# Patient Record
Sex: Male | Born: 1970 | Race: Asian | Hispanic: No | Marital: Married | Smoking: Former smoker
Health system: Southern US, Community
[De-identification: ages and names within clinical notes are randomized; demographics above are authoritative.]

---

## 2013-10-14 ENCOUNTER — Ambulatory Visit: Payer: Self-pay | Admitting: Family Medicine

## 2013-10-14 VITALS — BP 144/86 | HR 64 | Temp 99.1°F | Resp 18 | Ht 66.5 in | Wt 153.0 lb

## 2013-10-14 DIAGNOSIS — L0100 Impetigo, unspecified: Secondary | ICD-10-CM

## 2013-10-14 MED ORDER — DOXYCYCLINE HYCLATE 100 MG PO TABS: 100.0000 mg | ORAL_TABLET | Freq: Two times a day (BID) | ORAL | Status: DC

## 2013-10-14 NOTE — Patient Instructions (Signed)
Ch?c L? (Impetigo) Ch?c l? l nhi?m trng da, ph? bi?n nh?t ? tr? nh? v tr? em. NGUYN NHN B?nh ny gy b?i vi trng t? c?u khu?n ho?c lin c?u khu?n (vi khu?n). Ch?c l? c th? b?t ??u sau b?t k? t?n th??ng no c?a da. T?n th??ng da c th? do nh?ng th? nh?:  Th?y ??u.  V?t co x??c.  Tr?y da.  Cn trng c?n (th??ng khi tr? gi v?t c?n).  V?t ??t.  C?n ho?c nhai mng tay. Ch?c l? c th? ly. N c th? ly truy?n t? ng??i ny sang ng??i khc. Trnh ch?m vo da ho?c dng chung kh?n t?m ho?c qu?n o. TRI?U CH?NG Ch?c l? th??ng b?t ??u ? d?ng nh?ng m?n n??c nh? ho?c m?n m?. Sau ? chng chuy?n thnh nh?ng v?t l? lot (th??ng t?n) c v?y mu vng. C?ng c th? c:  M?n n??c l?n.  Ng?a ho?c ?au.  M?.  H?ch b?ch huy?t s?ng. Khi gi, kch thch ho?c khng ?i?u tr?, nh?ng vng nh? ny c th? tr? nn l?n h?n. Gi c th? lm cho vi trng dnh vo d??i mng tay; sau ? gi ln ph?n khc c?a da c th? lm nhi?m trng ly sang ?. CH?N ?ON Ch?n ?on ch?c l? th??ng ???c th?c hi?n b?ng vi?c khm th?c th?. C?y da (xt nghi?m ?? lm vi khu?n m?c) c th? ???c th?c hi?n ?? ch?ng t? ch?n ?on ho?c ?? gip quy?t ??nh ph??ng php ?i?u tr? t?t nh?t. ?I?U TR? Ch?c l? nh? c th? ???c ?i?u tr? b?ng kem khng sinh k ??n. Thu?c khng sinh u?ng c th? ???c s? d?ng trong nh?ng tr??ng h?p n?ng h?n. Thu?c ch?a ng?a c th? ???c s? d?ng. H??NG D?N CH?M [place] T?I NH  ?? trnh ly lan ch?c l? sang nh?ng vng khc c?a c? th?:  Hy gi? mng tay ng?n v s?ch s?.  Trnh gi.  Che vng b? nhi?m trng n?u c?n ?? trnh gi.  Nh? nhng r?a vng b? nhi?m trng b?ng x phng c khng sinh v n??c.  Ngm vng c v?y b?ng n??c x phng ?m c s? d?ng x phng khng sinh.  Nh? nhng c? nh?ng vng ny ?? b? v?y. Khng ch?i.  Th??ng xuyn r?a tay ?? trnh ly lan b?nh nhi?m trng ny.  Gi? tr? em b? ch?c l? ? nh khng cho ??n tr??ng ho?c nh tr? cho ??n khi chng ? s? d?ng kem khng sinh trong 48 ti?ng (2 ngy)  ho?c thu?c khng sinh u?ng trong 24 ti?ng (1 ngy), v da chng c?i thi?n ?ng k?.  Tr? c th? ??n tr??ng ho?c nh tr? n?u chng ch? b? vi v?t lot v n?u cc v?t lot ny c th? ???c che b?ng b?ng ho?c qu?n o. HY ?I KHM N?U:  C nhi?u m?n n??c ho?c v?t lot h?n m?c d ? ?i?u tr?.  Cc thnh vin khc trong gia ?nh b? cc v?t lot.  Pht ban khng c?i thi?n sau 48 ti?ng (2 ngy) ?i?u tr?. HY NGAY L?P T?C ?I KHM N?U:  B?n th?y ly lan cc v?t ?? ho?c s?ng t?y c?a da quanh cc v?t lot.  B?n nhn th?y nh?ng v?t s?c ?? t? cc v?t lot.  Con b?n b? s?t t? 100,4 F (37,2 C) tr? ln.  Con b?n b? ?au h?ng.  Con b?n c v? m?t m?i (l? ph?, kh ch?u trong d? dy). Document Released: 09/11/2005 Document Revised: 05/14/2013 [facility] Patient Information 2014 [place], [place].

## 2013-10-14 NOTE — Progress Notes (Signed)
° °  Subjective:    Patient ID: Samuel Smith, male    DOB: [birth year], 43 y.o.   MRN: [id number]  HPI This chart was scribed for Kenyon AnaKurt Lauenstein-MD by Smiley HousemanFallon Davis, Scribe. This patient was seen in room 8 and the patient's care was started at 5:20 PM.  HPI Comments: Samuel Smith is a 43 y.o. male who presents to the [facility] complaining of an infection on his right lower abdomen that arose more than 2 weeks ago.  Pt denies pain to the area, but states the area itches.  Pt denies any other sites of infection, but states his entire stomach itches.  Pt states he tried ring worm cream without relief.  Pt does not speak [place] and he doesn't currently work.  His sister-in-law translated for him in today's visit.      No past surgical history on file.  Family History  Problem Relation Age of Onset   Stroke Father    Cancer Brother     History   Social History   Marital Status: Married    Spouse Name: N/A    Number of Children: N/A   Years of Education: N/A   Occupational History   Not on file.   Social History Main Topics   Smoking status: Former Smoker   Smokeless tobacco: Not on file   Alcohol Use: Yes   Drug Use: No   Sexual Activity: Not on file   Other Topics Concern   Not on file   Social History Narrative   No narrative on file    No Known Allergies  There are no active problems to display for this patient.   No results found for this or any previous visit.  Review of Systems  Constitutional: Negative for fever and chills.  HENT: Negative for congestion and rhinorrhea.   Respiratory: Negative for cough and shortness of breath.   Cardiovascular: Negative for chest pain.  Gastrointestinal: Negative for nausea, vomiting, abdominal pain and diarrhea.  Musculoskeletal: Negative for back pain.  Skin: Positive for rash. Negative for color change.  Neurological: Negative for syncope.       Objective:   Physical Exam  2cm crusty,  elevated plaque right mid abdomen without erythema at margins     Assessment & Plan:   Impetigo - Plan: doxycycline (VIBRA-TABS) 100 MG tablet  Signed, Elvina SidleKurt Lauenstein, MD  Signed, Elvina SidleKurt Lauenstein, MD

## 2013-10-23 ENCOUNTER — Ambulatory Visit (INDEPENDENT_AMBULATORY_CARE_PROVIDER_SITE_OTHER): Payer: Self-pay | Admitting: Family Medicine

## 2013-10-23 VITALS — BP 120/62 | HR 80 | Temp 98.1°F | Resp 16 | Ht 67.0 in | Wt 154.6 lb

## 2013-10-23 DIAGNOSIS — L0100 Impetigo, unspecified: Secondary | ICD-10-CM

## 2013-10-23 MED ORDER — MUPIROCIN CALCIUM 2 % EX CREA: 1.0000 "application " | TOPICAL_CREAM | Freq: Two times a day (BID) | CUTANEOUS | Status: DC

## 2013-10-23 NOTE — Progress Notes (Signed)
43 year old [place] gentleman returns after one week of doxycycline having been treated for impetigo. He notices no difference. He does have 13 mm satellite lesion just above the 1 x 2 cm right lower quadrant scaly plaque. There is no surrounding erythema.  Patient is now tried antifungals as well as doxycycline.   Objective: Exam confirms patient's observations. He's had no improvement.  Assessment: Unusual plaque-like lesion which may require biopsy or dermatology referral.  Plan:Impetigo - Plan: mupirocin cream (BACTROBAN) 2 %  Signed, Elvina SidleKurt Rosmarie Esquibel, MD

## 2013-11-21 ENCOUNTER — Other Ambulatory Visit: Payer: Self-pay

## 2013-11-21 DIAGNOSIS — L0100 Impetigo, unspecified: Secondary | ICD-10-CM

## 2013-11-21 NOTE — Telephone Encounter (Signed)
Dr L, do you want to RF this? Pended.

## 2013-11-21 NOTE — Telephone Encounter (Signed)
Patient's sister called on behalf of patient requesting RX Refill on Mupirocin 2% 15 grams. Please call patient at [phone].     Thank You!!!

## 2013-11-22 MED ORDER — MUPIROCIN CALCIUM 2 % EX CREA: 1.0000 "application " | TOPICAL_CREAM | Freq: Two times a day (BID) | CUTANEOUS | Status: DC

## 2015-04-06 ENCOUNTER — Ambulatory Visit (INDEPENDENT_AMBULATORY_CARE_PROVIDER_SITE_OTHER): Payer: 59

## 2015-04-06 ENCOUNTER — Ambulatory Visit (INDEPENDENT_AMBULATORY_CARE_PROVIDER_SITE_OTHER): Payer: 59 | Admitting: Family Medicine

## 2015-04-06 VITALS — BP 110/80 | HR 63 | Temp 98.1°F | Resp 18 | Ht 66.25 in | Wt 143.1 lb

## 2015-04-06 DIAGNOSIS — R634 Abnormal weight loss: Secondary | ICD-10-CM

## 2015-04-06 DIAGNOSIS — Z836 Family history of other diseases of the respiratory system: Secondary | ICD-10-CM

## 2015-04-06 DIAGNOSIS — R12 Heartburn: Secondary | ICD-10-CM | POA: Diagnosis not present

## 2015-04-06 DIAGNOSIS — R197 Diarrhea, unspecified: Secondary | ICD-10-CM

## 2015-04-06 DIAGNOSIS — R0602 Shortness of breath: Secondary | ICD-10-CM

## 2015-04-06 DIAGNOSIS — Z831 Family history of other infectious and parasitic diseases: Secondary | ICD-10-CM

## 2015-04-06 DIAGNOSIS — K219 Gastro-esophageal reflux disease without esophagitis: Secondary | ICD-10-CM

## 2015-04-06 LAB — POCT CBC
GRANULOCYTE PERCENT: 61.4 % (ref 37–80)
HEMATOCRIT: 48.4 % (ref 43.5–53.7)
HEMOGLOBIN: 16.3 g/dL (ref 14.1–18.1)
Lymph, poc: 1.6 (ref 0.6–3.4)
MCH: 30.3 pg (ref 27–31.2)
MCHC: 33.7 g/dL (ref 31.8–35.4)
MCV: 89.8 fL (ref 80–97)
MID (CBC): 0.6 (ref 0–0.9)
MPV: 7.9 fL (ref 0–99.8)
POC Granulocyte: 3.6 (ref 2–6.9)
POC LYMPH PERCENT: 27.6 %L (ref 10–50)
POC MID %: 11 %M (ref 0–12)
Platelet Count, POC: 326 10*3/uL (ref 142–424)
RBC: 5.39 M/uL (ref 4.69–6.13)
RDW, POC: 12.7 %
WBC: 5.9 10*3/uL (ref 4.6–10.2)

## 2015-04-06 LAB — POCT UA - MICROSCOPIC ONLY
Bacteria, U Microscopic: NEGATIVE
Casts, Ur, LPF, POC: NEGATIVE
Crystals, Ur, HPF, POC: NEGATIVE
Mucus, UA: NEGATIVE
Yeast, UA: NEGATIVE

## 2015-04-06 LAB — POCT URINALYSIS DIPSTICK
BILIRUBIN UA: NEGATIVE
Blood, UA: NEGATIVE
Glucose, UA: NEGATIVE
KETONES UA: NEGATIVE
LEUKOCYTES UA: NEGATIVE
Nitrite, UA: NEGATIVE
Protein, UA: NEGATIVE
SPEC GRAV UA: 1.01
Urobilinogen, UA: 0.2
pH, UA: 6.5

## 2015-04-06 LAB — COMPREHENSIVE METABOLIC PANEL
ALBUMIN: 4.9 g/dL (ref 3.5–5.2)
ALK PHOS: 53 U/L (ref 39–117)
ALT: 24 U/L (ref 0–53)
AST: 18 U/L (ref 0–37)
BUN: 8 mg/dL (ref 6–23)
CALCIUM: 9.5 mg/dL (ref 8.4–10.5)
CO2: 25 mEq/L (ref 19–32)
CREATININE: 0.84 mg/dL (ref 0.50–1.35)
Chloride: 103 mEq/L (ref 96–112)
GLUCOSE: 82 mg/dL (ref 70–99)
Potassium: 3.9 mEq/L (ref 3.5–5.3)
SODIUM: 140 meq/L (ref 135–145)
TOTAL PROTEIN: 7.8 g/dL (ref 6.0–8.3)
Total Bilirubin: 0.8 mg/dL (ref 0.2–1.2)

## 2015-04-06 MED ORDER — OMEPRAZOLE 40 MG PO CPDR: 40.0000 mg | DELAYED_RELEASE_CAPSULE | Freq: Every day | ORAL | Status: DC

## 2015-04-06 NOTE — Patient Instructions (Signed)
Take omeprazole 40 mg one daily for stomach  If he continues to have problems we may need to send him to a specialist to get his stomach scoped and tested more.  Avoid excessive beer since it causes diarrhea for you  Return if not doing better over the next 3 or 4 weeks.  Try to eat a regular diet, not skipping meals.

## 2015-04-06 NOTE — Progress Notes (Signed)
Subjective:  Patient ID: Samuel Smith, male    DOB: [birth year]  Age: 44 y.o. MRN: [id number]  44 year old [place] American man who has been in the [place] for 2 [place]. He was last back in [place] in [place]. He currently is working doing nails. He has been feeling bad for the last couple of weeks. He has had several things going on. He has appetite has not been good. He has lost about 7 pounds. He drinks beer and it goes right through him with diarrhea even after just a diffuse weeks of beer. He has been having heartburn. He feels like he has trouble getting his breath. No wheezing or cough. He does not have a lot of abdominal pain. He complains of a lot of bubbles in his urine. He is not having headaches or dizziness. No cardiac symptoms. No other GI or GU symptoms. No blood in his stool. His extremities and skin are normal. He does not speak hardly any [place]. He has an interpreter with him today.  There is a family history of tuberculosis in his father. This is one thing he worries about.   Objective:   Pleasant alert gentleman in no major acute distress. No visible shortness of breath. His TMs are normal. Throat clear. Neck supple without nodes or thyromegaly. He has couple large scars at the right angle of his mouth from old injury. He has a little abrasion on his lip which he has a plaster on. His chest is clear to auscultation. Heart regular without murmurs. Abdomen soft without mass or tenderness. No axillary or inguinal nodes. No cervical nodes. Genitorectal exam not done. Extremities normal.  Results for orders placed or performed in visit on 04/06/15  POCT CBC  Result Value Ref Range   WBC 5.9 4.6 - 10.2 K/uL   Lymph, poc 1.6 0.6 - 3.4   POC LYMPH PERCENT 27.6 10 - 50 %L   MID (cbc) 0.6 0 - 0.9   POC MID % 11.0 0 - 12 %M   POC Granulocyte 3.6 2 - 6.9   Granulocyte percent 61.4 37 - 80 %G   RBC 5.39 4.69 - 6.13 M/uL   Hemoglobin 16.3 14.1 - 18.1 g/dL   HCT, POC 78.248.4 95.643.5 - 53.7 %   MCV  89.8 80 - 97 fL   MCH, POC 30.3 27 - 31.2 pg   MCHC 33.7 31.8 - 35.4 g/dL   RDW, POC 21.312.7 %   Platelet Count, POC 326 142 - 424 K/uL   MPV 7.9 0 - 99.8 fL  POCT UA - Microscopic Only  Result Value Ref Range   WBC, Ur, HPF, POC 0-1    RBC, urine, microscopic 0-1    Bacteria, U Microscopic neg    Mucus, UA neg    Epithelial cells, urine per micros 0-1    Crystals, Ur, HPF, POC neg    Casts, Ur, LPF, POC neg    Yeast, UA neg   POCT urinalysis dipstick  Result Value Ref Range   Color, UA yellow    Clarity, UA clear    Glucose, UA neg    Bilirubin, UA neg    Ketones, UA neg    Spec Grav, UA 1.010    Blood, UA neg    pH, UA 6.5    Protein, UA neg    Urobilinogen, UA 0.2    Nitrite, UA neg    Leukocytes, UA Negative Negative   [facility] reading (PRIMARY) by  Dr. Alwyn RenHopper Normal chest x-ray.  Assessment & Plan:   Assessment: Diarrhea Weight loss GERD/heartburn Family history of tuberculosis Sore throat  Plan: Check a CBC, urinalysis, blood chemistries panel, and chest x-ray There are no Patient Instructions on file for this visit.   HOPPER,DAVID, MD 04/06/2015

## 2015-04-08 ENCOUNTER — Encounter: Payer: Self-pay | Admitting: Family Medicine

## 2015-08-18 ENCOUNTER — Ambulatory Visit (INDEPENDENT_AMBULATORY_CARE_PROVIDER_SITE_OTHER): Payer: 59 | Admitting: Family Medicine

## 2015-08-18 VITALS — BP 110/74 | HR 68 | Temp 98.0°F | Resp 16 | Ht 66.0 in | Wt 150.6 lb

## 2015-08-18 DIAGNOSIS — R059 Cough, unspecified: Secondary | ICD-10-CM

## 2015-08-18 DIAGNOSIS — R05 Cough: Secondary | ICD-10-CM

## 2015-08-18 DIAGNOSIS — J019 Acute sinusitis, unspecified: Secondary | ICD-10-CM | POA: Diagnosis not present

## 2015-08-18 DIAGNOSIS — Z23 Encounter for immunization: Secondary | ICD-10-CM

## 2015-08-18 DIAGNOSIS — J029 Acute pharyngitis, unspecified: Secondary | ICD-10-CM

## 2015-08-18 LAB — POCT RAPID STREP A (OFFICE): Rapid Strep A Screen: NEGATIVE

## 2015-08-18 MED ORDER — BENZONATATE 200 MG PO CAPS: 200.0000 mg | ORAL_CAPSULE | Freq: Three times a day (TID) | ORAL | Status: DC | PRN

## 2015-08-18 MED ORDER — AMOXICILLIN 500 MG PO TABS: 500.0000 mg | ORAL_TABLET | Freq: Two times a day (BID) | ORAL | Status: DC

## 2015-08-18 MED ORDER — HYDROCODONE-HOMATROPINE 5-1.5 MG/5ML PO SYRP: 5.0000 mL | ORAL_SOLUTION | Freq: Every evening | ORAL | Status: DC | PRN

## 2015-08-18 NOTE — Progress Notes (Signed)
Chief Complaint:  Chief Complaint  Patient presents with  . Sore Throat    x 1 week  . Cough    x 1 week  . Flu Vaccine    HPI: Samuel Smith is a 44 y.o. male who reports to [facility] today complaining of   Sore throat for the last 4 days and also white productive cough for the same time. He ahs ahd no fevers orc hills. The ocugh is worse at night. Denies any allergies or asthma or TB sxs.  He has tried otc meds without relief. He denies any sick contacts, no n/v/abd pain/rashes/diarrhea. He is a nonsmoker, he deneis ear pain or sinus issues int he past.   He would like the flu vaccine.   History reviewed. No pertinent past medical history. History reviewed. No pertinent past surgical history. Social History   Social History  . Marital Status: Married    Spouse Name: N/A  . Number of Children: N/A  . Years of Education: N/A   Social History Main Topics  . Smoking status: Former Games developer  . Smokeless tobacco: None  . Alcohol Use: Yes  . Drug Use: No  . Sexual Activity: Not Asked   Other Topics Concern  . None   Social History Narrative   Family History  Problem Relation Age of Onset  . Stroke Father   . Cancer Brother    No Known Allergies Prior to Admission medications   Medication Sig Start Date End Date Taking? Authorizing Provider  mupirocin cream (BACTROBAN) 2 % Apply 1 application topically 2 (two) times daily. Patient not taking: Reported on 04/06/2015    Elvina Sidle, MD  omeprazole (PRILOSEC) 40 MG capsule Take 1 capsule (40 mg total) by mouth daily. Patient not taking: Reported on 08/18/2015 04/06/15   Peyton Najjar, MD     ROS: The patient denies fevers, chills, night sweats, unintentional weight loss, chest pain, palpitations, wheezing, dyspnea on exertion, nausea, vomiting, abdominal pain, dysuria, hematuria, melena, numbness, weakness, or tingling.   All other systems have been reviewed and were otherwise negative with the exception of those  mentioned in the HPI and as above.    PHYSICAL EXAM: Filed Vitals:   08/18/15 0935  BP: 110/74  Pulse: 68  Temp: 98 F (36.7 C)  Resp: 16   Body mass index is 24.32 kg/(m^2).   General: Alert, no acute distress HEENT:  Normocephalic, atraumatic, oropharynx patent. EOMI, PERRLA Erythematous throat, no exudates, TM normal, + sinus tenderness, + erythematous/boggy nasal mucosa, + erythematous tonsils.  Cardiovascular:  Regular rate and rhythm, no rubs murmurs or gallops.  Respiratory: Clear to auscultation bilaterally.  No wheezes, rales, or rhonchi.  No cyanosis, no use of accessory musculature Abdominal: No organomegaly, abdomen is soft and non-tender, positive bowel sounds. No masses. Skin: No rashes. Neurologic: Facial musculature symmetric. Psychiatric: Patient acts appropriately throughout our interaction. Lymphatic: No cervical or submandibular lymphadenopathy Musculoskeletal: Gait intact. No edema, tenderness   LABS: Results for orders placed or performed in visit on 08/18/15  POCT rapid strep A  Result Value Ref Range   Rapid Strep A Screen Negative Negative     EKG/XRAY:   Primary read interpreted by [facility] Conley [facility]s at [facility].   ASSESSMENT/PLAN: Encounter Diagnoses  Name Primary?  . Acute pharyngitis, unspecified pharyngitis type   . Need for influenza vaccination   . Cough   . Acute rhinosinusitis Yes   Rx Amoxacillin  Rx  Tessalon perles, hycodan OTC nasacort  Flu  Vaccine  Given  Fu prn   Gross sideeffects, risk and benefits, and alternatives of medications d/w patient. Patient is aware that all medications have potential sideeffects and we are unable to predict every sideeffect or drug-drug interaction that may occur.  Stellah Donovan DO  08/18/2015 10:08 AM

## 2015-08-19 LAB — CULTURE, GROUP A STREP: Organism ID, Bacteria: NORMAL

## 2015-12-27 ENCOUNTER — Ambulatory Visit (INDEPENDENT_AMBULATORY_CARE_PROVIDER_SITE_OTHER): Payer: BLUE CROSS/BLUE SHIELD | Admitting: Family Medicine

## 2015-12-27 VITALS — BP 118/72 | HR 62 | Temp 97.9°F | Resp 14 | Ht 66.0 in | Wt 152.8 lb

## 2015-12-27 DIAGNOSIS — Z0000 Encounter for general adult medical examination without abnormal findings: Secondary | ICD-10-CM

## 2015-12-27 DIAGNOSIS — R5382 Chronic fatigue, unspecified: Secondary | ICD-10-CM

## 2015-12-27 DIAGNOSIS — R0600 Dyspnea, unspecified: Secondary | ICD-10-CM

## 2015-12-27 DIAGNOSIS — K219 Gastro-esophageal reflux disease without esophagitis: Secondary | ICD-10-CM | POA: Diagnosis not present

## 2015-12-27 DIAGNOSIS — R0689 Other abnormalities of breathing: Secondary | ICD-10-CM

## 2015-12-27 DIAGNOSIS — S0300XA Dislocation of jaw, unspecified side, initial encounter: Secondary | ICD-10-CM

## 2015-12-27 LAB — COMPLETE METABOLIC PANEL WITH GFR
ALT: 22 U/L (ref 9–46)
AST: 18 U/L (ref 10–40)
Albumin: 4.8 g/dL (ref 3.6–5.1)
Alkaline Phosphatase: 49 U/L (ref 40–115)
BUN: 10 mg/dL (ref 7–25)
CO2: 28 mmol/L (ref 20–31)
Calcium: 9.8 mg/dL (ref 8.6–10.3)
Chloride: 103 mmol/L (ref 98–110)
Creat: 1.16 mg/dL (ref 0.60–1.35)
GFR, Est African American: 89 mL/min (ref >=60)
GFR, Est Non African American: 77 mL/min (ref >=60)
Glucose, Bld: 90 mg/dL (ref 65–99)
Potassium: 4.3 mmol/L (ref 3.5–5.3)
Sodium: 139 mmol/L (ref 135–146)
Total Bilirubin: 0.6 mg/dL (ref 0.2–1.2)
Total Protein: 7.7 g/dL (ref 6.1–8.1)

## 2015-12-27 LAB — POCT CBC
Granulocyte percent: 59.9 %G (ref 37–80)
HCT, POC: 43.6 % (ref 43.5–53.7)
Hemoglobin: 15.4 g/dL (ref 14.1–18.1)
Lymph, poc: 1.7 (ref 0.6–3.4)
MCH, POC: 32.1 pg — AB (ref 27–31.2)
MCHC: 35.4 g/dL (ref 31.8–35.4)
MCV: 90.5 fL (ref 80–97)
MID (cbc): 0.8 (ref 0–0.9)
MPV: 8.3 fL (ref 0–99.8)
POC Granulocyte: 3.7 (ref 2–6.9)
POC LYMPH PERCENT: 28 %L (ref 10–50)
POC MID %: 12.1 %M — AB (ref 0–12)
Platelet Count, POC: 264 10*3/uL (ref 142–424)
RBC: 4.81 M/uL (ref 4.69–6.13)
RDW, POC: 12.4 %
WBC: 6.2 10*3/uL (ref 4.6–10.2)

## 2015-12-27 LAB — POCT URINALYSIS DIP (MANUAL ENTRY)
Bilirubin, UA: NEGATIVE
Blood, UA: NEGATIVE
Glucose, UA: NEGATIVE
Ketones, POC UA: NEGATIVE
Leukocytes, UA: NEGATIVE
Nitrite, UA: NEGATIVE
Protein Ur, POC: NEGATIVE
Spec Grav, UA: 1.015
Urobilinogen, UA: 0.2
pH, UA: 7

## 2015-12-27 LAB — LIPID PANEL
Cholesterol: 277 mg/dL — ABNORMAL HIGH (ref 125–200)
HDL: 49 mg/dL (ref >=40)
LDL Cholesterol: 202 mg/dL — ABNORMAL HIGH (ref <130)
Total CHOL/HDL Ratio: 5.7 Ratio — ABNORMAL HIGH (ref <=5.0)
Triglycerides: 129 mg/dL (ref <150)
VLDL: 26 mg/dL (ref <30)

## 2015-12-27 LAB — POCT SEDIMENTATION RATE: POCT SED RATE: 24 mm/hr — AB (ref 0–22)

## 2015-12-27 LAB — TSH: TSH: 0.95 mIU/L (ref 0.40–4.50)

## 2015-12-27 MED ORDER — MELOXICAM 7.5 MG PO TABS: 7.5000 mg | ORAL_TABLET | Freq: Every day | ORAL | Status: DC

## 2015-12-27 MED ORDER — RANITIDINE HCL 150 MG PO TABS: 150.0000 mg | ORAL_TABLET | Freq: Two times a day (BID) | ORAL | Status: DC

## 2015-12-27 NOTE — Progress Notes (Signed)
Patient ID: Samuel Smith MRN: [id number], DOB: 10/05/1970 45 y.o. Date of Encounter: 12/27/2015, 12:24 PM  Primary Physician: No primary care provider on file.  Chief Complaint: Physical (CPE)  HPI: 45 y.o. y/o male with history noted below here for CPE.  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  Complains of left jaw joint pain  Review of Systems: Consitutional: No fever, chills, fatigue, night sweats, lymphadenopathy, or weight changes. Eyes: No visual changes, eye redness, or discharge. ENT/Mouth: Ears: No otalgia, tinnitus, hearing loss, discharge. Nose: No congestion, rhinorrhea, sinus pain, or epistaxis. Throat: No sore throat, post nasal drip, or teeth pain. Cardiovascular: No CP, palpitations, diaphoresis, DOE, edema, orthopnea, PND. Respiratory: No cough, hemoptysis, SOB, or wheezing. Gastrointestinal: No anorexia, dysphagia, reflux, pain, nausea, vomiting, hematemesis, diarrhea, constipation, BRBPR, or melena. Genitourinary: No dysuria, frequency, urgency, hematuria, incontinence, nocturia, decreased urinary stream, discharge, impotence, or testicular pain/masses. Musculoskeletal: No decreased ROM, myalgias, stiffness, joint swelling, or weakness. Skin: No rash, erythema, lesion changes, pain, warmth, jaundice, or pruritis. Neurological: No headache, dizziness, syncope, seizures, tremors, memory loss, coordination problems, or paresthesias. Psychological: No anxiety, depression, hallucinations, SI/HI. Endocrine: No fatigue, polydipsia, polyphagia, polyuria, or known diabetes. All other systems were reviewed and are otherwise negative.  No past medical history on file.   No past surgical history on file.  Home Meds:  Prior to Admission medications   Medication Sig Start Date End Date Taking? Authorizing Provider  valACYclovir (VALTREX) 500 MG tablet Take 500 mg by mouth 2 (two) times daily.    Yes Historical Provider, MD  amoxicillin (AMOXIL) 500 MG tablet Take 1 tablet (500 mg total) by mouth 2 (two) times daily. Patient not taking: Reported on 12/27/2015 08/18/15   Thao P Le, DO  benzonatate (TESSALON) 200 MG capsule Take 1 capsule (200 mg total) by mouth 3 (three) times daily as needed for cough. Patient not taking: Reported on 12/27/2015 08/18/15   Thao P Le, DO  HYDROcodone-homatropine (HYCODAN) 5-1.5 MG/5ML syrup Take 5 mLs by mouth at bedtime as needed. Patient not taking: Reported on 12/27/2015 08/18/15   Thao P Le, DO    Allergies: No Known Allergies  Social History   Social History  . Marital Status: Married    Spouse Name: N/A  . Number of Children: N/A  . Years of Education: N/A   Occupational History  . Not on file.   Social History Main Topics  . Smoking status: Former Games developer  . Smokeless tobacco: Not on file  . Alcohol Use: Yes  . Drug Use: No  . Sexual Activity: Not on file   Other Topics Concern  . Not on file   Social History Narrative    Family History  Problem Relation Age of Onset  . Stroke Father   . Cancer Brother     Physical Exam: Blood pressure 118/72, pulse 62, temperature 97.9 F (36.6 C), temperature source Oral, resp. rate 14, height  (1.676 m), weight 152 lb 12.8 oz (69.31 kg), SpO2 98 %.  BP Readings from Last 3 Encounters:  12/27/15 118/72  08/18/15 110/74  04/06/15 110/80   General: Well developed, well nourished, in no acute distress. HEENT: Normocephalic, atraumatic. Conjunctiva pink, sclera non-icteric. Pupils 2 mm constricting to 1 mm, round, regular, and equally reactive to light and accomodation. EOMI.  Fundi benign   Internal auditory canal clear. TMs with good cone of light and without pathology.  Nasal mucosa pink. Nares are without discharge. No sinus tenderness. Oral mucosa pink. Dentition good. Pharynx without exudate.    Neck: Supple. Trachea midline. No thyromegaly. Full ROM. No lymphadenopathy. Lungs:  Clear to auscultation bilaterally without wheezes, rales, or rhonchi. Breathing is of normal effort and unlabored. Cardiovascular: RRR with S1 S2. No murmurs, rubs, or gallops appreciated. Distal pulses 2+ symmetrically. No carotid or abdominal bruits Abdomen: Soft, non-tender, non-distended with normoactive bowel sounds. No hepatosplenomegaly or masses. No rebound/guarding. No CVA tenderness. Without hernias.   Musculoskeletal: Full range of motion and 5/5 strength throughout. Without swelling, atrophy, tenderness, crepitus, or warmth. Extremities without clubbing, cyanosis, or edema. Calves supple. Skin: Warm and moist without erythema, ecchymosis, wounds, or rash. Neuro: A+Ox3. CN II-XII grossly intact. Moves all extremities spontaneously. Full sensation throughout. Normal gait. DTR 2+ throughout upper and lower extremities. Finger to nose intact. Psych:  Responds to questions appropriately with a normal affect.   No results found for: CHOL No results found for: HDL No results found for: LDLCALC No results found for: TRIG No results found for: CHOLHDL No results found for: LDLDIRECT  Assessment/Plan:  45 y.o. y/o healthy male here for CPE   ICD-9-CM ICD-10-CM   1. Annual physical exam V70.0 Z00.00 POCT CBC     POCT SEDIMENTATION RATE     COMPLETE METABOLIC PANEL WITH GFR     TSH     Lipid panel     POCT urinalysis dipstick  2. Dyspnea and respiratory abnormality 786.09 R06.00 ranitidine (ZANTAC) 150 MG tablet    R06.89   3. Chronic fatigue 780.79 R53.82   4. TMJ (dislocation of temporomandibular joint), initial encounter 830.0 S03.0XXA meloxicam (MOBIC) 7.5 MG tablet  5. Gastroesophageal reflux disease without esophagitis 530.81 K21.9 ranitidine (ZANTAC) 150 MG tablet   Recheck 1 month  Signed, Elvina SidleKurt Tyon Cerasoli, MD 12/27/2015 12:24 PM

## 2015-12-27 NOTE — Patient Instructions (Addendum)
Recheck one month   B?nh tro ng??c d? dy th?c qu?n, Ng??i l?n (Gastroesophageal Reflux Disease, Adult) Thng th??ng, th?c ?n di chuy?n xu?ng th?c qu?n v ? l?i d? dy ?? tiu ha. Tuy nhin, khi m?t ng??i b? b?nh tro ng??c d? dy th?c qu?n (GERD), th?c ?n v a xt trong d? dy tro ng??c tr? l?i th?c qu?n. Khi tnh tr?ng ny x?y ra, th?c qu?n b? lot v vim. D?n d?n, GERD c th? t?o ra nh?ng l? nh? (v?t lot) trn l?p nim m?c th?c qu?n.  NGUYN NHN Tnh tr?ng ny gy ra b?i m?t v?n ?? c?a ph?n c? gi?a th?c qu?n v d? dy (c? th?t th?c qu?n d??i, hay LES). Thng th??ng c? LES ?ng l?i sau khi th?c ?n ?i qua th?c qu?n vo d? dy. Khi LES b? y?u ho?c b?t th??ng, c? khng ?ng theo ?ng cch v ?i?u ? cho php th?c ?n v a xt d? dy tro ng??c tr? l?i th?c qu?n. LES c th? b? y?u do m?t s? ch?t ?n king nh?t ??nh, thu?c v cc tnh tr?ng b?nh l, bao g?m:  S? d?ng thu?c l.  Mang thai.  Thot v? honh.  S? d?ng nhi?u r??u.  M?t s? lo?i th?c ?n v ?? u?ng nh?t ??nh, nh? c ph, s c la, hnh v b?c h. CC Y?U T? NGUY C? Tnh tr?ng ny hay x?y ra h?n ?:  Nh?ng ng??i t?ng cn.  Nh?ng ng??i c cc b?nh ? m lin k?t.  Nh?ng ng??i s? d?ng thu?c NSAID. TRI?U CH?NG Nh?ng tri?u ch?ng c?a tnh tr?ng ny bao g?m:  ? nng.  Kh nu?t ho?c ?au khi nu?t.  C?m th?y nh? c m?t kh?i c?c trong c? h?ng.  C?m gic ??ng trong mi?ng.  H?i th? hi.  C nhi?u n??c b?t.  C?m gic kh ch?u trong b?ng ho?c ch??ng b?ng.  ? h?i.  ?au ng?c.  Kh th? ho?c th? kh kh.  Ho lin t?c (m?n tnh) ho?c ho vo ban ?m.  B? h?ng l?p men r?ng.  S?t cn. Nh?ng tnh tr?ng khc nhau c th? gy ?au ng?c. B?o ??m ph?i ??n khm chuyn gia ch?m [place] s?c kh?e n?u qu v? b? ?au ng?c. CH?N ?ON Chuyn gia ch?m [place] s?c kh?e c?a qu v? s? h?i v? b?nh s? v khm th?c th? cho qu v?. ?? xc ??nh qu v? b? GERD nh? hay n?ng, chuyn gia ch?m [place] s?c kh?e c?ng c th? theo di qu v? ?p ?ng v?i vi?c ?i?u tr? nh?  th? no. Qu v? c?ng c th? ph?i lm cc ki?m tra khc, bao g?m:  N?i soi ?? ki?m tra d? dy v th?c qu?n b?ng m?t camera nh?.  Ki?m tra ?o n?ng ?? a xt trong th?c qu?n c?a qu v?.  Ki?m tra ?o m?c p l?c ln th?c qu?n c?a qu v?.  Nu?t bari ho?c nu?t bari ?i?u ch?nh ?? hi?n th? hnh dng, kch th??c v ch?c n?ng c?a th?c qu?n c?a qu v?. ?I?U TR? M?c tiu c?a ?i?u tr? l gip gi?m cc tri?u ch?ng v trnh bi?n ch?ng. Vi?c ?i?u tr? b?nh ny c th? khc nhau ty thu?c m?c ?? n?ng c?a tri?u ch?ng. Chuyn gia ch?m [place] s?c kh?e c?a qu v? c th? khuy?n ngh?:  Thay ??i ch? ?? ?n.  Thu?c.  Ph?u thu?t. H??NG D?N CH?M [place] T?I NH Ch? ?? ?n  Tun th? m?t ch? ?? ?n theo khuy?n ngh? c?a chuyn gia ch?m  s?c kh?e.  Vi?c ny c th? l trnh cc th?c ?n v ?? u?ng nh?:  C ph v tr (c ho?c khng c caffeine).  ?? u?ng c ch?ar??u.  ?? u?ng t?ng l?c v ?? u?ng dng trong th? thao.  ?? u?ng c ga ho?c soda.  S c la v c ca.  B?c h v h??ng v? b?c h.  T?i v hnh.  C?i ng?a (Horseradish).  Cc th?c ?n nhi?u gia v? v a xt, bao g?m h?t tiu, b?t ?t, b?t ca ri, gi?m, n??c s?t cay, v n??c s?t barbecue.  N??c qu? ho?c qu? h? cam qut, ch?ng h?n nh? cam, chanh v chanh l cam.  Cc th?c ?n c c chua, nh? n??c x?t ??, ?t, n??c x?t salsa, v pizza km x?t ??  Th?c ?n chin v nhi?u ch?t bo, ch?ng h?n nh? bnh rn, khoai ty chin, khoai ty rn v n??c x?t nhi?u ch?t bo.  Th?t nhi?u ch?t bo, ch?ng h?n nh? hot dog (bnh m k?p xc xch) v cc lo?i th?t ?? v tr?ng nhi?u m?, ch?ng h?n nh? th?t n?c l?ng, xc xch, gi?m bng v th?t l?n xng khi.  Nh?ng s?n ph?m b? s?a giu ch?t bo, nh? s?a nguyn kem, b? v pho mt kem.  ?n cc b?a nh?, th??ng xuyn thay v cc b?a no.  Trnh u?ng nhi?u n??c khi qu v? ?n.  Trnh ?n trong kho?ng 2-3 gi? tr??c khi ?i ng?.  Trnh n?m xu?ng ngay sau khi ?n.  Khngt?p th? d?c ngay sau khi ?n. H??ng d?n chung  Ch  ??n nh?ng thay  ??i v? tri?u ch?ng c?a qu v?.  Ch? s? d?ng thu?c khng c?n k ??n v thu?c c?n k ??n theo ch? d?n c?a chuyn gia ch?m [place] s?c kh?e. Khng dng aspirin, ibuprofen, ho?c cc thu?c NSAID khc tr? khi chuyn gia ch?m [place] s?c kh?e c?a qu v? cho php.  Khng s? d?ng b?t k? s?n ph?m thu?c l no, bao g?m thu?c l d?ng ht, thu?c l d?ng nhai v thu?c l ?i?n t?. N?u qu v? c?n gip ?? ?? cai thu?c, hy h?i chuyn gia ch?m [place] s?c kh?e.  M?c qu?n o r?ng. Khng m?c ci g ch?t quanh eo m c th? t?o p l?c ln b?ng.  Nng (nng cao) ??u gi??ng c?a qu v? thm 6 inch (15 cm).  C? g?ng gi?m c?ng th?ng, ch?ng h?n nh? t?p yoga ho?c thi?n. N?u qu v? c?n gip ?? ?? gi?m c?ng th?ng, hy h?i chuyn gia ch?m [place] s?c kh?e.  N?u qu v? th?a cn, hy gi?m cn n?ng v? m?c c l?i cho s?c kh?e c?a qu v?. Hy h?i chuyn gia ch?m [place] s?c kh?e ?? ???c h??ng d?n v? m?c tiu gi?m cn an ton.  Tun th? t?t c? cc cu?c h?n khm l?i theo ch? d?n c?a chuyn gia ch?m  s?c kh?e. ?i?u ny c vai tr quan tr?ng. ?I KHM N?U:  Qu v? c cc tri?u ch?ng m?i.  Qu v? b? s?t cn khng r nguyn nhn.  Qu v? b? kh nu?t ho?c b? ?au khi nu?t.  Qu v? th? kh kh ho?c ho dai d?ng.  Cc tri?u ch?ng c?a qu v? khng c?i thi?n sau khi ???c ?i?u tr?[place].  Qu v? b? kh?n gi?ng. NGAY L?P T?C ?I KHM N?U:  Qu v? b? ?au ? cnh tay, c?, hm, r?ng ho?c l?ng.  Qu v? th?y ?? m? hi, chng m?t ho?c chong vng.  Qu v? b? ?au  ng?c ho?c kh th?.  Qu v? nn v ch?t nn ra gi?ng nh? mu ho?c b c ph.  Qu v? b? ng?t.  Phn c?a qu v? c mu ho?c mu ?en.  Qu v? khng th? nu?t, u?ng hay ?n.   Thng tin ny khng nh?m m?c ?ch thay th? cho l?i khuyn m chuyn gia ch?m [place] s?c kh?e ni v?i qu v?. Hy b?o ??m qu v? ph?i th?o lu?n b?t k? v?n ?? g m qu v? c v?i chuyn gia ch?m [place] s?c kh?e c?a qu v?.   Document Released: 06/21/2005 Document Revised: 06/02/2015 Elsevier Interactive Patient Education [employer].

## 2016-01-03 ENCOUNTER — Telehealth: Payer: Self-pay

## 2016-01-03 DIAGNOSIS — S0300XA Dislocation of jaw, unspecified side, initial encounter: Secondary | ICD-10-CM

## 2016-01-03 DIAGNOSIS — K219 Gastro-esophageal reflux disease without esophagitis: Secondary | ICD-10-CM

## 2016-01-03 DIAGNOSIS — R0689 Other abnormalities of breathing: Principal | ICD-10-CM

## 2016-01-03 DIAGNOSIS — R0600 Dyspnea, unspecified: Secondary | ICD-10-CM

## 2016-01-03 NOTE — Telephone Encounter (Signed)
Patient lost his medications. Patient stated he lost his jacket and medication was placed inside of jacket. Mobic 7.5 MG AND Zantac 150 MG. [facility] [address] [phone].

## 2016-01-03 NOTE — Telephone Encounter (Signed)
Can we replace? 

## 2016-01-04 MED ORDER — RANITIDINE HCL 150 MG PO TABS: 150.0000 mg | ORAL_TABLET | Freq: Two times a day (BID) | ORAL | Status: DC

## 2016-01-04 MED ORDER — MELOXICAM 7.5 MG PO TABS: 7.5000 mg | ORAL_TABLET | Freq: Every day | ORAL | Status: DC

## 2016-01-04 NOTE — Telephone Encounter (Signed)
I have replaced.

## 2016-01-04 NOTE — Telephone Encounter (Signed)
Pt advised.

## 2016-03-23 ENCOUNTER — Ambulatory Visit (INDEPENDENT_AMBULATORY_CARE_PROVIDER_SITE_OTHER): Payer: BLUE CROSS/BLUE SHIELD | Admitting: Family Medicine

## 2016-03-23 VITALS — BP 122/72 | HR 68 | Temp 98.1°F | Resp 17 | Ht 66.5 in | Wt 152.0 lb

## 2016-03-23 DIAGNOSIS — J029 Acute pharyngitis, unspecified: Secondary | ICD-10-CM | POA: Diagnosis not present

## 2016-03-23 MED ORDER — IBUPROFEN 200 MG PO TABS
400.0000 mg | ORAL_TABLET | Freq: Once | ORAL | Status: AC
  Administered 2016-03-23: 400 mg via ORAL

## 2016-03-23 NOTE — Patient Instructions (Addendum)
??  i v?i ?au h?ng, b?n c th? dng ibuprofen trn 2 vin u?ng m?i 8 ??n 12 gi? n?u c?n. B?n c?ng c th? s? d?ng x?t c? h?ng b?ng Chloraseptic. U?ng ?? n??c ?? n??c ti?u c?a b?n vng nh?t. Hy tr? l?i n?u b?n b? s?t trn 101 ho?c n?u b?n g?p kh th? ho?c nu?t. B?n s? c?m th?y t?t h?n trong 2-3 ngy.   IF you received an x-ray today, you will receive an invoice from [facility]. Please contact [facility] at [phone] with questions or concerns regarding your invoice.   IF you received labwork today, you will receive an invoice from [facility]. Please contact [facility] at [phone] with questions or concerns regarding your invoice.   Our billing staff will not be able to assist you with questions regarding bills from these companies.  You will be contacted with the lab results as soon as they are available. The fastest way to get your results is to activate your My Chart account. Instructions are located on the last page of this paperwork. If you have not heard from us regarding the results in 2 weeks, please contact this office.

## 2016-03-23 NOTE — Progress Notes (Signed)
   Subjective:    Patient ID: Samuel Smith, male    DOB: 1971/05/29, 45 y.o.   MRN: [id number]  HPI This is a 45 yo male who speaks [place]. The interpreter phone line was used with interpreter # [phone]. The patient presents today with sore throat for 2 days. He has not been able to sleep due to pain. He has not had a fever, ear pain, headache, nasal congestion. He has had a little cough with throat clearing, no SOB, no difficulty swallowing or breathing. Sick contacts at work with cough. He has used cough drops and drank water with no relief.   No past medical history on file. No past surgical history on file. Family History  Problem Relation Age of Onset  . Stroke Father   . Cancer Brother    Social History  Substance Use Topics  . Smoking status: Former Games developermoker  . Smokeless tobacco: None  . Alcohol Use: Yes    Review of Systems Per HPI    Objective:   Physical Exam  Constitutional: He is oriented to person, place, and time. He appears well-developed and well-nourished. No distress.  HENT:  Head: Normocephalic and atraumatic.  Right Ear: External ear normal.  Left Ear: External ear normal.  Nose: Nose normal.  Mouth/Throat: Mucous membranes are normal. Posterior oropharyngeal erythema (mild) present. No oropharyngeal exudate.  Eyes: Conjunctivae are normal.  Neck: Normal range of motion. Neck supple.  Cardiovascular: Normal rate, regular rhythm and normal heart sounds.   Pulmonary/Chest: Effort normal and breath sounds normal.  Musculoskeletal: Normal range of motion.  Lymphadenopathy:    He has no cervical adenopathy.  Neurological: He is alert and oriented to person, place, and time.  Skin: Skin is warm and dry. He is not diaphoretic.  Psychiatric: He has a normal mood and affect. His behavior is normal. Judgment and thought content normal.  Vitals reviewed.     BP 122/72 mmHg  Pulse 68  Temp(Src) 98.1 F (36.7 C) (Oral)  Resp 17  Ht 5' 6.5" (1.689 m)  Wt 152 lb  (68.947 kg)  BMI 24.17 kg/m2  SpO2 97% Wt Readings from Last 3 Encounters:  03/23/16 152 lb (68.947 kg)  12/27/15 152 lb 12.8 oz (69.31 kg)  08/18/15 150 lb 9.6 oz (68.312 kg)       Assessment & Plan:  1. Acute pharyngitis, unspecified etiology - ibuprofen (ADVIL,MOTRIN) tablet 400 mg; Take 2 tablets (400 mg total) by mouth once. - low suspicion for strep without exudate, lymphadenopathy - provided written instructions and RTC precautions in [place] and verbal instructions provided through interpreter.   Olean Reeeborah Gessner, FNP-BC  [facility], [facility]  03/23/2016 9:34 AM

## 2016-03-25 ENCOUNTER — Ambulatory Visit (INDEPENDENT_AMBULATORY_CARE_PROVIDER_SITE_OTHER): Payer: BLUE CROSS/BLUE SHIELD

## 2016-03-25 ENCOUNTER — Ambulatory Visit (INDEPENDENT_AMBULATORY_CARE_PROVIDER_SITE_OTHER): Payer: BLUE CROSS/BLUE SHIELD | Admitting: Physician Assistant

## 2016-03-25 VITALS — BP 110/72 | HR 76 | Temp 98.9°F | Resp 18 | Ht 66.5 in | Wt 154.0 lb

## 2016-03-25 DIAGNOSIS — R059 Cough, unspecified: Secondary | ICD-10-CM

## 2016-03-25 DIAGNOSIS — R05 Cough: Secondary | ICD-10-CM | POA: Diagnosis not present

## 2016-03-25 DIAGNOSIS — D72829 Elevated white blood cell count, unspecified: Secondary | ICD-10-CM | POA: Diagnosis not present

## 2016-03-25 DIAGNOSIS — J189 Pneumonia, unspecified organism: Secondary | ICD-10-CM | POA: Diagnosis not present

## 2016-03-25 LAB — POCT CBC
Granulocyte percent: 78.5 %G (ref 37–80)
HEMATOCRIT: 41.1 % — AB (ref 43.5–53.7)
HEMOGLOBIN: 14.4 g/dL (ref 14.1–18.1)
LYMPH, POC: 1.6 (ref 0.6–3.4)
MCH, POC: 31.7 pg — AB (ref 27–31.2)
MCHC: 35.1 g/dL (ref 31.8–35.4)
MCV: 90.3 fL (ref 80–97)
MID (cbc): 1 — AB (ref 0–0.9)
MPV: 7.9 fL (ref 0–99.8)
PLATELET COUNT, POC: 236 10*3/uL (ref 142–424)
POC GRANULOCYTE: 9.3 — AB (ref 2–6.9)
POC LYMPH PERCENT: 13.4 %L (ref 10–50)
POC MID %: 8.1 %M (ref 0–12)
RBC: 4.55 M/uL — AB (ref 4.69–6.13)
RDW, POC: 12.3 %
WBC: 11.8 10*3/uL — AB (ref 4.6–10.2)

## 2016-03-25 LAB — POCT RAPID STREP A (OFFICE): RAPID STREP A SCREEN: NEGATIVE

## 2016-03-25 MED ORDER — IBUPROFEN 600 MG PO TABS: 600.0000 mg | ORAL_TABLET | Freq: Three times a day (TID) | ORAL | Status: DC | PRN

## 2016-03-25 MED ORDER — AZITHROMYCIN 250 MG PO TABS: ORAL_TABLET | ORAL | Status: DC

## 2016-03-25 NOTE — Patient Instructions (Signed)
     IF you received an x-ray today, you will receive an invoice from [facility]. Please contact [facility] at [phone] with questions or concerns regarding your invoice.   IF you received labwork today, you will receive an invoice from [facility]. Please contact [facility] at [phone] with questions or concerns regarding your invoice.   Our billing staff will not be able to assist you with questions regarding bills from these companies.  You will be contacted with the lab results as soon as they are available. The fastest way to get your results is to activate your My Chart account. Instructions are located on the last page of this paperwork. If you have not heard from us regarding the results in 2 weeks, please contact this office.      

## 2016-03-25 NOTE — Progress Notes (Signed)
03/25/2016 10:32 AM   DOB: 1971/03/08 / MRN: [id number]  SUBJECTIVE:  Samuel Smith is a 45 y.o. male presenting for 4 days of cough, sore throat, and subjective fever.  He feels that he is getting worse.  He was hear at the onset of the illness and was given two ibuprofen which helped.  CHL reveal Ms. Gessner suspected a vral URI.   He has No Known Allergies.   He  has no past medical history on file.    He  reports that he has quit smoking. He does not have any smokeless tobacco history on file. He reports that he drinks alcohol. He reports that he does not use illicit drugs. He  has no sexual activity history on file. The patient  has no past surgical history on file.  His family history includes Cancer in his brother; Stroke in his father.  Review of Systems  Constitutional: Positive for malaise/fatigue. Negative for fever, chills and diaphoresis.  HENT: Positive for congestion and sore throat.   Respiratory: Positive for cough. Negative for hemoptysis, shortness of breath and wheezing.   Cardiovascular: Negative for chest pain.  Gastrointestinal: Negative for nausea.  Skin: Negative for rash.  Neurological: Negative for dizziness and weakness.  Endo/Heme/Allergies: Negative for polydipsia.    Problem list and medications reviewed and updated by myself where necessary, and exist elsewhere in the encounter.   OBJECTIVE:  BP 110/72 mmHg  Pulse 76  Temp(Src) 98.9 F (37.2 C) (Oral)  Resp 18  Ht 5' 6.5" (1.689 m)  Wt 154 lb (69.854 kg)  BMI 24.49 kg/m2  SpO2 99%  Physical Exam  Constitutional: He is oriented to person, place, and time. He appears well-developed. He does not appear ill.  Eyes: Conjunctivae and EOM are normal. Pupils are equal, round, and reactive to light.  Cardiovascular: Normal rate.   Pulmonary/Chest: Effort normal.  Abdominal: He exhibits no distension.  Musculoskeletal: Normal range of motion.  Neurological: He is alert and oriented to person, place, and  time. No cranial nerve deficit. Coordination normal.  Skin: Skin is warm and dry. He is not diaphoretic.  Psychiatric: He has a normal mood and affect.  Nursing note and vitals reviewed.   Results for orders placed or performed in visit on 03/25/16 (from the past 72 hour(s))  POCT CBC     Status: Abnormal   Collection Time: 03/25/16  9:48 AM  Result Value Ref Range   WBC 11.8 (A) 4.6 - 10.2 K/uL   Lymph, poc 1.6 0.6 - 3.4   POC LYMPH PERCENT 13.4 10 - 50 %L   MID (cbc) 1.0 (A) 0 - 0.9   POC MID % 8.1 0 - 12 %M   POC Granulocyte 9.3 (A) 2 - 6.9   Granulocyte percent 78.5 37 - 80 %G   RBC 4.55 (A) 4.69 - 6.13 M/uL   Hemoglobin 14.4 14.1 - 18.1 g/dL   HCT, POC 65.741.1 (A) 84.643.5 - 53.7 %   MCV 90.3 80 - 97 fL   MCH, POC 31.7 (A) 27 - 31.2 pg   MCHC 35.1 31.8 - 35.4 g/dL   RDW, POC 96.212.3 %   Platelet Count, POC 236 142 - 424 K/uL   MPV 7.9 0 - 99.8 fL  POCT rapid strep A     Status: Normal   Collection Time: 03/25/16 10:19 AM  Result Value Ref Range   Rapid Strep A Screen Negative Negative    Dg Chest 2 View  03/25/2016  CLINICAL DATA:  Cough and sore throat for 4 days EXAM: CHEST  2 VIEW COMPARISON:  04/06/2015 FINDINGS: The heart size and mediastinal contours are within normal limits. Both lungs are clear. The visualized skeletal structures are unremarkable. IMPRESSION: No active cardiopulmonary disease. Electronically Signed   By: Alcide CleverMark  Lukens M.D.   On: 03/25/2016 10:15    ASSESSMENT AND PLAN  Airam was seen today for sore throat.  Diagnoses and all orders for this visit:  Cough -     POCT CBC -     DG Chest 2 View; Future  Leukocytosis: His work up is reassuring.  Given leukocytosis and that he is on day 4 of illness I would expect that if there were viral he would be mounting a lymphocytosis by now.  Will cover for CAP.   -     POCT rapid strep A -     Culture, Group A Strep    The patient was advised to call or return to clinic if he does not see an improvement in  symptoms or to seek the care of the closest emergency department if he worsens with the above plan.   Deliah BostonMichael Vernetta Dizdarevic, MHS, PA-C [facility] 03/25/2016 10:32 AM

## 2016-03-27 LAB — CULTURE, GROUP A STREP: Organism ID, Bacteria: NORMAL

## 2016-06-28 ENCOUNTER — Ambulatory Visit (INDEPENDENT_AMBULATORY_CARE_PROVIDER_SITE_OTHER): Payer: BLUE CROSS/BLUE SHIELD

## 2016-06-28 ENCOUNTER — Ambulatory Visit (INDEPENDENT_AMBULATORY_CARE_PROVIDER_SITE_OTHER): Payer: BLUE CROSS/BLUE SHIELD | Admitting: Family Medicine

## 2016-06-28 VITALS — BP 110/84 | HR 65 | Temp 98.5°F | Resp 17 | Ht 66.5 in | Wt 154.0 lb

## 2016-06-28 DIAGNOSIS — M542 Cervicalgia: Secondary | ICD-10-CM | POA: Diagnosis not present

## 2016-06-28 LAB — COMPLETE METABOLIC PANEL WITH GFR
ALT: 27 U/L (ref 9–46)
AST: 18 U/L (ref 10–40)
Albumin: 4.7 g/dL (ref 3.6–5.1)
Alkaline Phosphatase: 49 U/L (ref 40–115)
BUN: 11 mg/dL (ref 7–25)
CALCIUM: 9.5 mg/dL (ref 8.6–10.3)
CHLORIDE: 104 mmol/L (ref 98–110)
CO2: 25 mmol/L (ref 20–31)
CREATININE: 0.99 mg/dL (ref 0.60–1.35)
Glucose, Bld: 128 mg/dL — ABNORMAL HIGH (ref 65–99)
POTASSIUM: 3.7 mmol/L (ref 3.5–5.3)
Sodium: 139 mmol/L (ref 135–146)
Total Bilirubin: 0.6 mg/dL (ref 0.2–1.2)
Total Protein: 7.5 g/dL (ref 6.1–8.1)

## 2016-06-28 LAB — CBC WITH DIFFERENTIAL/PLATELET
Basophils Absolute: 0 cells/uL (ref 0–200)
Basophils Relative: 0 %
EOS ABS: 280 {cells}/uL (ref 15–500)
Eosinophils Relative: 5 %
HEMATOCRIT: 45.8 % (ref 38.5–50.0)
Hemoglobin: 15.6 g/dL (ref 13.2–17.1)
LYMPHS PCT: 25 %
Lymphs Abs: 1400 cells/uL (ref 850–3900)
MCH: 30.8 pg (ref 27.0–33.0)
MCHC: 34.1 g/dL (ref 32.0–36.0)
MCV: 90.5 fL (ref 80.0–100.0)
MONOS PCT: 10 %
MPV: 10.5 fL (ref 7.5–12.5)
Monocytes Absolute: 560 cells/uL (ref 200–950)
NEUTROS ABS: 3360 {cells}/uL (ref 1500–7800)
Neutrophils Relative %: 60 %
Platelets: 306 10*3/uL (ref 140–400)
RBC: 5.06 MIL/uL (ref 4.20–5.80)
RDW: 13.1 % (ref 11.0–15.0)
WBC: 5.6 10*3/uL (ref 3.8–10.8)

## 2016-06-28 LAB — TSH: TSH: 1.23 mIU/L (ref 0.40–4.50)

## 2016-06-28 MED ORDER — MELOXICAM 15 MG PO TABS: 15.0000 mg | ORAL_TABLET | Freq: Every day | ORAL | 0 refills | Status: DC

## 2016-06-28 NOTE — Progress Notes (Signed)
Patient ID: Samuel Smith, male    DOB: [birth year], 45 y.o.   MRN: [id number]  PCP: No primary care provider on file.  Chief Complaint  Patient presents with  . Numbness    In neck onset 1 week    Subjective:   HPI Interpreter Service [phone], Samuel Smith   45 year old presents with neck pain times 1 week ago. Pain "feels like something is in the way when I turn my neck". No injury or sudden injury. Pain is more pronounced with swallowing. Denies pain is inside of the throat or suffering injury/neck trauma. 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  He denies taking any medication but has been using a honey candy for pain relief within minimal relief. He reports that he is able to feel the mass in his neck by pushing on the left side of  His neck. Denies fever, fatigue, cough, or difficulty swallowing.  Social History   Social History  . Marital status: Married    Spouse name: N/A  . Number of children: N/A  . Years of education: N/A   Occupational History  . Not on file.   Social History Main Topics  . Smoking status: Former Games developermoker  . Smokeless tobacco: Not on file  . Alcohol use Yes  . Drug use: No  . Sexual activity: Not on file   Other Topics Concern  . Not on file   Social History Narrative  . No narrative on file   Family History  Problem Relation Age of Onset  . Stroke Father   . Cancer Brother    Review of Systems See HPI There are no active problems to display for this patient.   Prior to Admission medications   Not on File   No Known Allergies     Objective:  Physical Exam  Constitutional: He is oriented to person, place, and time. He appears well-developed and well-nourished.  HENT:  Head: Normocephalic and atraumatic.  Right Ear: External ear normal.  Left Ear: External ear normal.  Mouth/Throat: Oropharynx is clear and moist.  Eyes: Conjunctivae and EOM are normal. Pupils are equal,  round, and reactive to light.  Neck: Normal range of motion. Neck supple. No JVD present. No neck rigidity. Erythema present. No tracheal deviation and no edema present. No thyromegaly present.    Complete ROM of neck-negative for reproducible tenderness with movement.  Cardiovascular: Normal rate, normal heart sounds and intact distal pulses.   Pulmonary/Chest: Effort normal and breath sounds normal.  Musculoskeletal: Normal range of motion.  Lymphadenopathy:    He has no cervical adenopathy.  Neurological: He is alert and oriented to person, place, and time.  Skin: Skin is warm and dry.  Psychiatric: He has a normal mood and affect. His behavior is normal. Judgment and thought content normal.    Vitals:   06/28/16 1057  BP: 110/84  Pulse: 65  Resp: 17  Temp: 98.5 F (36.9 C)    Dg Cervical Spine Complete  Result Date: 06/28/2016 CLINICAL DATA:  Neck pain EXAM: CERVICAL SPINE - COMPLETE 4+ VIEW COMPARISON:  None. FINDINGS: There is no evidence of cervical spine fracture or prevertebral soft tissue swelling. Alignment is normal. No other significant bone abnormalities are identified. No significant foraminal narrowing. Incidental nuchal ligament calcification. IMPRESSION: Minor cervical spondylosis. Electronically Signed   By: Elsie StainJohn T Curnes M.D.   On: 06/28/2016 11:49   Assessment & Plan:  [id number] [facility] Interpreter  1. Neck pain - DG Cervical Spine Complete; Future - TSH - CBC with Differential - COMPLETE METABOLIC PANEL WITH GFR - US Soft Tissue Head/Neck;   46 year old, vietnamese male, presents with atypical neck pain. He reports that he was treated for a "tumor" of the neck in [place] although, there are no medical records providing information of this treatment.  Unable to palpate neck mass. X-ray was unremarkable for acute findings. Ordered an ultrasound of neck to rule out the presence of a mass. Will treat neck pain symptomatically until further diagnostic testing is  completed.  Plan: - meloxicam (MOBIC) 15 MG tablet; Take 1 tablet (15 mg total) by mouth daily as needed.  Will follow-up once ultrasound is completed.  Godfrey Pick. Tiburcio Pea, MSN, FNP-C [facility]

## 2016-06-28 NOTE — Patient Instructions (Signed)
C th? dng Meloxicam 15 mg x 1 l?n / ngy ?? gi?m ?au c?. Ti s? lin l?c v?i b?n v? k?t qu? xt nghi?m c?a b?n. Tr? l?i ?? ???c ch?m [place] n?u c?n.   May take Meloxicam 15 mg once daily for neck pain.  I will follow-up with you regarding your test results.  Return for care if needed.  Godfrey PickKimberly S. Tiburcio PeaHarris, MSN, FNP-C [facility]      IF you received an x-ray today, you will receive an invoice from [facility]. Please contact [facility] at [phone] with questions or concerns regarding your invoice.   IF you received labwork today, you will receive an invoice from [facility]. Please contact [facility] at [phone] with questions or concerns regarding your invoice.   Our billing staff will not be able to assist you with questions regarding bills from these companies.  You will be contacted with the lab results as soon as they are available. The fastest way to get your results is to activate your My Chart account. Instructions are located on the last page of this paperwork. If you have not heard from us regarding the results in 2 weeks, please contact this office.

## 2016-06-29 ENCOUNTER — Encounter: Payer: Self-pay | Admitting: Family Medicine

## 2016-06-29 NOTE — Progress Notes (Signed)
June 29, 2016   Daichi Moris [address] [place] [address]   Dear Mr. Aragones,  Below are the results from your recent visit and they were as expected.  Resulted Orders  TSH  Result Value Ref Range   TSH 1.23 0.40 - 4.50 mIU/L   Narrative   Performed at:  [address]                [place], [place] [address]  CBC with Differential  Result Value Ref Range   WBC 5.6 3.8 - 10.8 K/uL   RBC 5.06 4.20 - 5.80 MIL/uL   Hemoglobin 15.6 13.2 - 17.1 g/dL   HCT 45.8 38.5 - 50.0 %   MCV 90.5 80.0 - 100.0 fL   MCH 30.8 27.0 - 33.0 pg   MCHC 34.1 32.0 - 36.0 g/dL   RDW 13.1 11.0 - 15.0 %   Platelets 306 140 - 400 K/uL   MPV 10.5 7.5 - 12.5 fL   Neutro Abs 3,360 1,500 - 7,800 cells/uL   Lymphs Abs 1,400 850 - 3,900 cells/uL   Monocytes Absolute 560 200 - 950 cells/uL   Eosinophils Absolute 280 15 - 500 cells/uL   Basophils Absolute 0 0 - 200 cells/uL   Neutrophils Relative % 60 %   Lymphocytes Relative 25 %   Monocytes Relative 10 %   Eosinophils Relative 5 %   Basophils Relative 0 %   Smear Review Criteria for review not met    Narrative   Performed at:  [facility]                [address]                [place], [place] [address]  COMPLETE METABOLIC PANEL WITH GFR  Result Value Ref Range   Sodium 139 135 - 146 mmol/L   Potassium 3.7 3.5 - 5.3 mmol/L   Chloride 104 98 - 110 mmol/L   CO2 25 20 - 31 mmol/L   Glucose, Bld 128 (H) 65 - 99 mg/dL   BUN 11 7 - 25 mg/dL   Creat 0.99 0.60 - 1.35 mg/dL   Total Bilirubin 0.6 0.2 - 1.2 mg/dL   Alkaline Phosphatase 49 40 - 115 U/L   AST 18 10 - 40 U/L   ALT 27 9 - 46 U/L   Total Protein 7.5 6.1 - 8.1 g/dL   Albumin 4.7 3.6 - 5.1 g/dL   Calcium 9.5 8.6 - 10.3 mg/dL   GFR, Est African American >89 >=60 mL/min   GFR, Est Non African American >89 >=60 mL/min   Narrative   Performed at:  [address]           [place], [place] [address]     If you have any questions or concerns, please don't hesitate to call.  Sincerely,   Molli Barrows, FNP

## 2016-07-02 DIAGNOSIS — M542 Cervicalgia: Secondary | ICD-10-CM | POA: Insufficient documentation

## 2016-07-19 ENCOUNTER — Ambulatory Visit
Admission: RE | Admit: 2016-07-19 | Discharge: 2016-07-19 | Disposition: A | Discharge status: Home / Self Care | Payer: BLUE CROSS/BLUE SHIELD | Source: Ambulatory Visit | Attending: Family Medicine | Admitting: Family Medicine

## 2016-07-19 DIAGNOSIS — M542 Cervicalgia: Secondary | ICD-10-CM

## 2016-07-20 ENCOUNTER — Telehealth: Payer: Self-pay | Admitting: Family Medicine

## 2016-07-20 NOTE — Telephone Encounter (Signed)
Pt states that someone just called and didn't leave a message I don't see any messages

## 2016-07-25 ENCOUNTER — Encounter: Payer: Self-pay | Admitting: Radiology

## 2016-10-06 ENCOUNTER — Ambulatory Visit (INDEPENDENT_AMBULATORY_CARE_PROVIDER_SITE_OTHER): Payer: BLUE CROSS/BLUE SHIELD | Admitting: Physician Assistant

## 2016-10-06 VITALS — BP 124/80 | HR 69 | Temp 98.7°F | Resp 17 | Ht 67.5 in | Wt 155.0 lb

## 2016-10-06 DIAGNOSIS — J029 Acute pharyngitis, unspecified: Secondary | ICD-10-CM

## 2016-10-06 MED ORDER — AMOXICILLIN 875 MG PO TABS: 875.0000 mg | ORAL_TABLET | Freq: Two times a day (BID) | ORAL | 0 refills | Status: AC

## 2016-10-06 MED ORDER — IBUPROFEN 800 MG PO TABS: 800.0000 mg | ORAL_TABLET | Freq: Three times a day (TID) | ORAL | 0 refills | Status: AC | PRN

## 2016-10-06 NOTE — Patient Instructions (Addendum)
     IF you received an x-ray today, you will receive an invoice from [facility]. Please contact  Radiology at [phone] with questions or concerns regarding your invoice.   IF you received labwork today, you will receive an invoice from [facility]. Please contact [facility] at [phone] with questions or concerns regarding your invoice.   Our billing staff will not be able to assist you with questions regarding bills from these companies.  You will be contacted with the lab results as soon as they are available. The fastest way to get your results is to activate your My Chart account. Instructions are located on the last page of this paperwork. If you have not heard from us regarding the results in 2 weeks, please contact this office.     

## 2016-10-06 NOTE — Progress Notes (Signed)
   Samuel Smith  MRN: [id number] DOB: 24-May-1971  Subjective:  Pt presents to clinic for sore throat for the last 3 days that seems to be getting worse.  No congestion or PND.  Mild cough.  2 days ago he felt chills but no fevers.  No abd pain or N/V.  He took left over amoxicillin yesterday for 2 doses.    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 Negative for fever.  HENT: Positive for sore throat. Negative for congestion.   Gastrointestinal: Negative.     Patient Active Problem List   Diagnosis Date Noted  . Neck pain 07/02/2016    No current outpatient prescriptions on file prior to visit.   No current facility-administered medications on file prior to visit.     No Known Allergies  Pt patients past, family and social history were reviewed and updated.   Objective:  BP 124/80 (BP Location: Right Arm, Patient Position: Sitting, Cuff Size: Normal)   Pulse 69   Temp 98.7 F (37.1 C) (Oral)   Resp 17   Ht 5' 7.5" (1.715 m)   Wt 155 lb (70.3 kg)   SpO2 97%   BMI 23.92 kg/m   Physical Exam  Constitutional: He is oriented to person, place, and time and well-developed, well-nourished, and in no distress.  HENT:  Head: Normocephalic and atraumatic.  Right Ear: Hearing, tympanic membrane, external ear and ear canal normal.  Left Ear: Hearing, tympanic membrane, external ear and ear canal normal.  Nose: Nose normal.  Mouth/Throat: Uvula is midline and mucous membranes are normal. Posterior oropharyngeal edema (mild) and posterior oropharyngeal erythema present. No oropharyngeal exudate.  Eyes: Conjunctivae are normal.  Neck: Normal range of motion.  Cardiovascular: Normal rate, regular rhythm and normal heart sounds.   Pulmonary/Chest: Effort normal and breath sounds normal. He has no wheezes.  Lymphadenopathy:       Head (right side): No tonsillar adenopathy present.       Head (left side):  No tonsillar adenopathy present.    He has cervical adenopathy.       Left cervical: Superficial cervical adenopathy present.       Right: No supraclavicular adenopathy present.       Left: No supraclavicular adenopathy present.  Neurological: He is alert and oriented to person, place, and time. Gait normal.  Skin: Skin is warm and dry.  Psychiatric: Mood, memory, affect and judgment normal.    Assessment and Plan :  Sore throat - Plan: amoxicillin (AMOXIL) 875 MG tablet, ibuprofen (ADVIL,MOTRIN) 800 MG tablet  Pt has a erythematous throat but has taken 2 doses of abx so I am concerned that his throat culture will be negative - I will go ahead and treat but had a long discussion with patient that in the future this is not a good idea because of inability for correct diagnosis.  Symptomatic treatment d/w pt. Benny LennertSarah Kyung Muto PA-C  [facility] at [facility] 10/06/2016 10:48 AM

## 2016-12-28 ENCOUNTER — Ambulatory Visit (INDEPENDENT_AMBULATORY_CARE_PROVIDER_SITE_OTHER): Payer: BLUE CROSS/BLUE SHIELD | Admitting: Physician Assistant

## 2016-12-28 VITALS — BP 112/73 | HR 59 | Temp 98.0°F | Resp 16 | Wt 151.2 lb

## 2016-12-28 DIAGNOSIS — K5900 Constipation, unspecified: Secondary | ICD-10-CM

## 2016-12-28 DIAGNOSIS — Z8379 Family history of other diseases of the digestive system: Secondary | ICD-10-CM

## 2016-12-28 NOTE — Patient Instructions (Addendum)
I will contact you with the results of your labs when they come back.   For constipation   Make sure you are drinking enough water daily. Make sure you are getting enough fiber in your diet - this will make you regular - you can eat high fiber foods or use metamucil as a supplement - it is really important to drink enough water when using fiber supplements.  If your stools are hard or are formed balls or you have to strain a stool softener will help - use colace 2-3 capsule a day  For gentle treatment of constipation Use Miralax 1-2 capfuls a day until your stools are soft and regular and then decrease the usage - you can use this daily  For more aggressive treatment of constipation Use 4 capfuls of Colace and 6 doses of Miralax and drink it in 2 hours - this should result in several watery stools - if it does not repeat the next day and then go to daily miralax for a week to make sure your bowels are clean and retrained to work properly  For the most aggressive treatment of constipation Use 14 capfuls of Miralax in 1 gallon of fluid (gatoraid or water work well or a combination of the two) and drink over 12h - it is ok to eat during this time and then use Miralax 1 capful daily for about 2 weeks to prevent the constipation from returning   To bn, Ng??i l?n (Constipation, Adult) To bn l khi m?t ng??i ?i ??i ti?n trong m?t tu?n t h?n so v?i bnh th??ng, ??i ti?n kh kh?n, ho?c ??i ti?n ra phn kh, c?ng, ho?c to h?n bnh th??ng. To bn c th? do m?t tnh tr?ng bn trong gy ra. N c th? tr? ln tr?m tr?ng h?n theo ?? tu?i n?u m?t ng??i dng cc lo?i thu?c nh?t ??nh v khng u?ng ?? n??c. H??NG D?N CH?M [place] T?I NH ?n v u?ng  ?n th?c ?n c nhi?u ch?t x? nh? tri cy t??i v rau, ng? c?c nguyn h?t v cc lo?i ??u.  H?n ch? th?c ?n c nhi?u ch?t bo, t ch?t x?, ho?c ch? bi?n s?n qu m?c, ch?ng h?n khoai ty chin, bnh hamburger, bnh quy, k?o v soda.  U?ng ?? n??c ?? gi? cho  n??c ti?u trong ho?c c mu vng nh?t. H??ng d?n chung  T?p th? d?c th??ng xuyn theo ch? d?n c?a chuyn gia ch?m [place] s?c kh?e.  ?i v? sinh ngay khi qu v? c nhu c?u. Khng nh?n ?i ??i ti?n.  Ch? s? d?ng thu?c khng c?n k ??n v thu?c c?n k ??n theo ch? d?n c?a chuyn gia ch?m [place] s?c kh?e. Cc thu?c ny bao g?m b?t k? th?c ph?m ch?c n?ng c ch?t x? no.  Th?c hnh cc bi t?p luy?n l?i c? ?y ch?u, ch?ng h?n nh? th? su trong lc th? gin b?ng d??i v th? gin ph?n ?y ch?u trong khi ??i ti?n.  Theo di tnh tr?ng c?a qu v? ?? pht hi?n b?t k? thay ??i no.  Tun th? t?t c? cc cu?c h?n khm l?i theo ch? d?n c?a chuyn gia ch?m  s?c kh?e. ?i?u ny c vai tr quan tr?ng. ?I KHM N?U:  Qu v? b? ?au va? ?au tr?m tr?ng h?n.  Qu v? b? s?t.  Qu v? khng ?i ??i ti?n sau 4 ngy.  Qu v? nn.  Qu v? khng ?i.  Qu v? b? s?t cn.  Qu v? b? ch?y  mu ? h?u mn.  Phn c?a qu v? m?ng, trng nh? bt ch. NGAY L?P T?C ?I KHM N?U:  Qu v? b? s?t v cc tri?u ch?ng c?a qu v? ??t nhin tr?m tr?ng h?n.  Qu v? sn phn ho?c c mu trong phn.  B?ng c?a qu v? b? ch??ng ln.  Qu v? b? ?au r?t nhi?u ? b?ng.  Qu v? c?m th?y chng m?t ho?c ng?t x?u. Thng tin ny khng nh?m m?c ?ch thay th? cho l?i khuyn m chuyn gia ch?m [place] s?c kh?e ni v?i qu v?. Hy b?o ??m qu v? ph?i th?o lu?n b?t k? v?n ?? g m qu v? c v?i chuyn gia ch?m [place] s?c kh?e c?a qu v?. Document Released: 12/27/2010 Document Revised: 10/02/2014 Document Reviewed: 03/01/2016 Elsevier Interactive Patient Education  2017 Elsevier Inc.  IF you received an x-ray today, you will receive an invoice from [facility]. Please contact [facility] at [phone] with questions or concerns regarding your invoice.   IF you received labwork today, you will receive an invoice from [place]. Please contact [facility] at [phone] with questions or concerns regarding your invoice.   Our billing  staff will not be able to assist you with questions regarding bills from these companies.  You will be contacted with the lab results as soon as they are available. The fastest way to get your results is to activate your My Chart account. Instructions are located on the last page of this paperwork. If you have not heard from [place] regarding the results in 2 weeks, please contact this office.

## 2016-12-28 NOTE — Progress Notes (Signed)
   Samuel Smith  MRN: [id number] DOB: 06/17/1971  PCP: No PCP Per Patient  Subjective:  Pt is a 46 year old male presents to clinic for constipation. He speaks [place] [employer]ch[employer], retail used today.  Check-up on liver. He believes there is a gene being passed around his family and he would like to be checked. FHx liver disease - mother passed away from liver disease in [place]. Sister had a liver transplant and she is in [place].  He is worried he has liver disease.  He endorses straining with bowel movements. No blood in stool.   Denies abdominal pain, skin or eye discolorations, swelling of feet, rash, abdominal bloating, changes with bowel movements, changes of stool color.  Last BM last night. He eats a traditional [place] diet. Does not exercise. Does not drink much water daily.   Review of Systems  Cardiovascular: Negative for chest pain, palpitations and leg swelling.  Gastrointestinal: Positive for constipation. Negative for abdominal pain, nausea and vomiting.  Skin: Negative.   Hematological: Negative.      Patient Active Problem List   Diagnosis Date Noted  . Neck pain 07/02/2016    Current Outpatient Prescriptions on File Prior to Visit  Medication Sig Dispense Refill  . amoxicillin (AMOXIL) 875 MG tablet Take 1 tablet (875 mg total) by mouth 2 (two) times daily. (Patient not taking: Reported on 12/28/2016) 20 tablet 0  . ibuprofen (ADVIL,MOTRIN) 800 MG tablet Take 1 tablet (800 mg total) by mouth every 8 (eight) hours as needed for moderate pain. (Patient not taking: Reported on 12/28/2016) 30 tablet 0  . Probiotic Product (PROBIOTIC-10 PO) Take by mouth.     No current facility-administered medications on file prior to visit.     No Known Allergies   Objective:  BP 112/73 (Cuff Size: Normal)   Pulse (!) 59   Temp 98 F (36.7 C) (Oral)   Resp 16   Wt 151 lb 3.2 oz (68.6 kg)   SpO2 98%   BMI 23.33 kg/m   Physical Exam  Eyes: Conjunctivae are normal. No  scleral icterus.  Abdominal: Soft. Normal appearance and bowel sounds are normal. He exhibits no pulsatile liver and no ascites. There is no hepatosplenomegaly. There is no tenderness.  Musculoskeletal:       Right lower leg: He exhibits no edema.       Left lower leg: He exhibits no edema.  Skin: Skin is warm and dry. He is not diaphoretic.  No jaundice     Assessment and Plan :  1. Family history of liver disease - CMP14+EGFR - HIV antibody - Hepatitis C antibody - Hepatitis B surface antibody - Informed pt his physical exam was not concerning for liver disease. He is relieved. Labs are pending, will contact with results.   2. Constipation, unspecified constipation type - Supportive care encouraged: Fiber, fluids, exercise. Information printed out for pt concerning constipation. RTC if no improvement in 3-4 weeks.    Mercer Pod, PA-C  Primary Care at [facility] 12/28/2016 4:11 PM

## 2016-12-30 LAB — CMP14+EGFR

## 2016-12-30 LAB — HIV ANTIBODY (ROUTINE TESTING W REFLEX)

## 2016-12-30 LAB — HEPATITIS B SURFACE ANTIBODY, QUANTITATIVE

## 2016-12-30 LAB — HEPATITIS C ANTIBODY

## 2016-12-30 NOTE — Progress Notes (Signed)
Any idea on where his labs could be? No results from Hep C, hep B or HIV because no serum was received. Do we need him to come back for lab only? Thanks!

## 2017-01-01 ENCOUNTER — Ambulatory Visit (INDEPENDENT_AMBULATORY_CARE_PROVIDER_SITE_OTHER): Payer: BLUE CROSS/BLUE SHIELD | Admitting: Family Medicine

## 2017-01-01 ENCOUNTER — Telehealth: Payer: Self-pay | Admitting: Family Medicine

## 2017-01-01 VITALS — BP 126/83 | HR 96 | Temp 100.3°F | Resp 18 | Ht 67.5 in | Wt 150.2 lb

## 2017-01-01 DIAGNOSIS — R509 Fever, unspecified: Secondary | ICD-10-CM

## 2017-01-01 DIAGNOSIS — J029 Acute pharyngitis, unspecified: Secondary | ICD-10-CM | POA: Diagnosis not present

## 2017-01-01 DIAGNOSIS — Z789 Other specified health status: Secondary | ICD-10-CM

## 2017-01-01 DIAGNOSIS — R12 Heartburn: Secondary | ICD-10-CM

## 2017-01-01 LAB — POCT RAPID STREP A (OFFICE): RAPID STREP A SCREEN: NEGATIVE

## 2017-01-01 MED ORDER — ACETAMINOPHEN 325 MG PO TABS
500.0000 mg | ORAL_TABLET | Freq: Once | ORAL | Status: AC
  Administered 2017-01-01: 487.5 mg via ORAL

## 2017-01-01 NOTE — Progress Notes (Signed)
By signing my name below, I, Samuel Smith, attest that this documentation has been prepared under the direction and in the presence of Samuel Staggers, MD.  Electronically Signed: [name], Medical Scribe. 01/01/17. 4:01 PM.  Subjective:    Patient ID: Samuel Smith, male    DOB: 1971/07/26, 46 y.o.   MRN: [id number]  HPI Chief Complaint  Patient presents with  . Sore Throat    x2days  . Fatigue  . Headache  . Generalized Body Aches  . Fever    temp was 100.3     HPI Comments: Samuel Smith is a 46 y.o. male who presents to the Primary Care at [facility] complaining of a sore throat that he's had for "a long time" which kept him from sleeping lastnight. Pt's native language is [place] so interpreter [phone] was used. Reports associated sxs of myalgias, cough, sneezing, HA, fatigue, sore throat onset 1 day, and low grade fever. Pt's wife and mother have colds but none of them have sore throat. Pt has been drinking water. No attempted treatment. Denies rhinorrhea and abdominal pain.  Heartburn: Reports heartburn for about a year after he eats as well as beltchig after eating. Pt took OTC herbal supplements, and turmeric, but no medications have been used. Pt also takes glucosamine supplement.  Bowel Movements: Pt passes small "bits and pieces" with his bowel movements. Denies melena or bloody stool.  Patient Active Problem List   Diagnosis Date Noted  . Neck pain 07/02/2016   No past medical history on file. No past surgical history on file. No Known Allergies Prior to Admission medications   Medication Sig Start Date End Date Taking? Authorizing Provider  amoxicillin (AMOXIL) 875 MG tablet Take 1 tablet (875 mg total) by mouth 2 (two) times daily. 10/06/16  Yes Morrell Riddle, PA-C  ibuprofen (ADVIL,MOTRIN) 800 MG tablet Take 1 tablet (800 mg total) by mouth every 8 (eight) hours as needed for moderate pain. 10/06/16  Yes Morrell Riddle, PA-C  Probiotic Product  (PROBIOTIC-10 PO) Take by mouth.   Yes Historical Provider, MD   Social History   Social History  . Marital status: Married    Spouse name: N/A  . Number of children: N/A  . Years of education: N/A   Occupational History  . Not on file.   Social History Main Topics  . Smoking status: Former Games developer  . Smokeless tobacco: Never Used  . Alcohol use Yes  . Drug use: No  . Sexual activity: Not on file   Other Topics Concern  . Not on file   Social History Narrative   From [place] - to [place] 2014    Review of Systems  Constitutional: Positive for fatigue and fever.  HENT: Positive for sneezing and sore throat. Negative for rhinorrhea.   Respiratory: Positive for cough.   Gastrointestinal: Negative for abdominal pain and blood in stool.  Musculoskeletal: Positive for myalgias.  Neurological: Positive for headaches.  Psychiatric/Behavioral: Positive for sleep disturbance.   Objective:  Physical Exam  Constitutional: He is oriented to person, place, and time. He appears well-developed and well-nourished.  HENT:  Head: Normocephalic and atraumatic.  Right Ear: Tympanic membrane, external ear and ear canal normal.  Left Ear: Tympanic membrane, external ear and ear canal normal.  Nose: No rhinorrhea.  Mouth/Throat: Mucous membranes are normal. Posterior oropharyngeal erythema (without PTA) present. No oropharyngeal exudate.  Eyes: Conjunctivae are normal. Pupils are equal, round, and reactive to light.  Neck:  Neck supple.  Cardiovascular: Normal rate, regular rhythm, normal heart sounds and intact distal pulses.  Exam reveals no gallop and no friction rub.   No murmur heard. Pulmonary/Chest: Effort normal and breath sounds normal. No respiratory distress. He has no wheezes. He has no rhonchi. He has no rales.  Abdominal: Soft. He exhibits no distension. There is no tenderness.  Lymphadenopathy:    He has no cervical adenopathy.  Neurological: He is alert and oriented to person,  place, and time.  Skin: Skin is warm and dry. No rash noted.  Psychiatric: He has a normal mood and affect. His behavior is normal.  Vitals reviewed.   Vitals:   01/01/17 1518  BP: 126/83  Pulse: 96  Resp: 18  Temp: 100.3 F (37.9 C)  TempSrc: Oral  SpO2: 98%  Weight: 150 lb 3.2 oz (68.1 kg)  Height: 5' 7.5" (1.715 m)  Body mass index is 23.18 kg/m.   Results for orders placed or performed in visit on 01/01/17  POCT rapid strep A  Result Value Ref Range   Rapid Strep A Screen Negative Negative   Assessment & Plan:    Samuel Smith is a 46 y.o. male Fever, unspecified fever cause - Plan: acetaminophen (TYLENOL) tablet 487.5 mg Sore throat - Plan: POCT rapid strep A, Culture, Group A Strep  -Negative rapid strep. Exam reassuring, doubt strep pharyngitis, but throat culture pending. Suspected viral illness, symptomatic care discussed, RTC precautions  Heartburn  - Trigger avoidance discussed with handout given. Zantac over-the-counter as needed. RTC precautions if symptoms persist next few weeks or worsening sooner.  Language barrier  - Video interpreter used with understanding expressed   Meds ordered this encounter  Medications  . acetaminophen (TYLENOL) tablet 487.5 mg   Patient Instructions    Zantac for heartburn - avoid foods that can cause heartburn below, and return to discuss these symptoms further in the next few weeks.  Can also discuss stool symptoms further at that time. Return to the clinic or go to the nearest emergency room if any of your symptoms worsen or new symptoms occur.  Strep test normal/negative. I will check a throat culture, but your sore throat is likely due to another virus. See information below. Cepacol if needed for sore throat.  Return to the clinic or go to the nearest emergency room if any of your symptoms worsen or new symptoms occur.    Sore Throat A sore throat is pain, burning, irritation, or scratchiness in the throat. When you  have a sore throat, you may feel pain or tenderness in your throat when you swallow or talk. Many things can cause a sore throat, including:  An infection.  Seasonal allergies.  Dryness in the air.  Irritants, such as smoke or pollution.  Gastroesophageal reflux disease (GERD).  A tumor. A sore throat is often the first sign of another sickness. It may happen with other symptoms, such as coughing, sneezing, fever, and swollen neck glands. Most sore throats go away without medical treatment. Follow these instructions at home:  Take over-the-counter medicines only as told by your health care provider.  Drink enough fluids to keep your urine clear or pale yellow.  Rest as needed.  To help with pain, try:  Sipping warm liquids, such as broth, herbal tea, or warm water.  Eating or drinking cold or frozen liquids, such as frozen ice pops.  Gargling with a salt-water mixture 3-4 times a day or as needed. To make a salt-water mixture,  completely dissolve -1 tsp of salt in 1 cup of warm water.  Sucking on hard candy or throat lozenges.  Putting a cool-mist humidifier in your bedroom at night to moisten the air.  Sitting in the bathroom with the door closed for 5-10 minutes while you run hot water in the shower.  Do not use any tobacco products, such as cigarettes, chewing tobacco, and e-cigarettes. If you need help quitting, ask your health care provider. Contact a health care provider if:  You have a fever for more than 2-3 days.  You have symptoms that last (are persistent) for more than 2-3 days.  Your throat does not get better within 7 days.  You have a fever and your symptoms suddenly get worse. Get help right away if:  You have difficulty breathing.  You cannot swallow fluids, soft foods, or your saliva.  You have increased swelling in your throat or neck.  You have persistent nausea and vomiting. This information is not intended to replace advice given to you  by your health care provider. Make sure you discuss any questions you have with your health care provider. Document Released: 10/19/2004 Document Revised: 05/07/2016 Document Reviewed: 07/02/2015 Elsevier Interactive Patient Education  2017 [employer].   Food Choices for Gastroesophageal Reflux Disease, Adult When you have gastroesophageal reflux disease (GERD), the foods you eat and your eating habits are very important. Choosing the right foods can help ease the discomfort of GERD. Consider working with a diet and nutrition specialist (dietitian) to help you make healthy food choices. What general guidelines should I follow? Eating plan   Choose healthy foods low in fat, such as fruits, vegetables, whole grains, low-fat dairy products, and lean meat, fish, and poultry.  Eat frequent, small meals instead of three large meals each day. Eat your meals slowly, in a relaxed setting. Avoid bending over or lying down until 2-3 hours after eating.  Limit high-fat foods such as fatty meats or fried foods.  Limit your intake of oils, butter, and shortening to less than 8 teaspoons each day.  Avoid the following:  Foods that cause symptoms. These may be different for different people. Keep a food diary to keep track of foods that cause symptoms.  Alcohol.  Drinking large amounts of liquid with meals.  Eating meals during the 2-3 hours before bed.  Cook foods using methods other than frying. This may include baking, grilling, or broiling. Lifestyle    Maintain a healthy weight. Ask your health care provider what weight is healthy for you. If you need to lose weight, work with your health care provider to do so safely.  Exercise for at least 30 minutes on 5 or more days each week, or as told by your health care provider.  Avoid wearing clothes that fit tightly around your waist and chest.  Do not use any products that contain nicotine or tobacco, such as cigarettes and e-cigarettes. If  you need help quitting, ask your health care provider.  Sleep with the head of your bed raised. Use a wedge under the mattress or blocks under the bed frame to raise the head of the bed. What foods are not recommended? The items listed may not be a complete list. Talk with your dietitian about what dietary choices are best for you. Grains  Pastries or quick breads with added fat. [place] toast. Vegetables  Deep fried vegetables. [place] fries. Any vegetables prepared with added fat. Any vegetables that cause symptoms. For some people this  may include tomatoes and tomato products, chili peppers, onions and garlic, and horseradish. Fruits  Any fruits prepared with added fat. Any fruits that cause symptoms. For some people this may include citrus fruits, such as oranges, grapefruit, pineapple, and lemons. Meats and other protein foods  High-fat meats, such as fatty beef or pork, hot dogs, ribs, ham, sausage, salami and bacon. Fried meat or protein, including fried fish and fried chicken. Nuts and nut butters. Dairy  Whole milk and chocolate milk. Sour cream. Cream. Ice cream. Cream cheese. Milk shakes. Beverages  Coffee and tea, with or without caffeine. Carbonated beverages. Sodas. Energy drinks. Fruit juice made with acidic fruits (such as orange or grapefruit). Tomato juice. Alcoholic drinks. Fats and oils  Butter. Margarine. Shortening. Ghee. Sweets and desserts  Chocolate and cocoa. Donuts. Seasoning and other foods  Pepper. Peppermint and spearmint. Any condiments, herbs, or seasonings that cause symptoms. For some people, this may include curry, hot sauce, or vinegar-based salad dressings. Summary  When you have gastroesophageal reflux disease (GERD), food and lifestyle choices are very important to help ease the discomfort of GERD.  Eat frequent, small meals instead of three large meals each day. Eat your meals slowly, in a relaxed setting. Avoid bending over or lying down until 2-3  hours after eating.  Limit high-fat foods such as fatty meat or fried foods. This information is not intended to replace advice given to you by your health care provider. Make sure you discuss any questions you have with your health care provider. Document Released: 09/11/2005 Document Revised: 09/12/2016 Document Reviewed: 09/12/2016 Elsevier Interactive Patient Education  2017 [employer].     IF you received an x-ray today, you will receive an invoice from [facility]. Please contact [facility] at [phone] with questions or concerns regarding your invoice.   IF you received labwork today, you will receive an invoice from [facility]. Please contact [facility] at [phone] with questions or concerns regarding your invoice.   Our billing staff will not be able to assist you with questions regarding bills from these companies.  You will be contacted with the lab results as soon as they are available. The fastest way to get your results is to activate your My Chart account. Instructions are located on the last page of this paperwork. If you have not heard from [place] regarding the results in 2 weeks, please contact this office.       I personally performed the services described in this documentation, which was scribed in my presence. The recorded information has been reviewed and considered for accuracy and completeness, addended by me as needed, and agree with information above.  Signed,   Samuel Staggers, MD Primary Care at [facility].  01/03/17 3:13 PM

## 2017-01-01 NOTE — Telephone Encounter (Signed)
l/m with pharmacy this is viral. cepacol, no antibiotic

## 2017-01-01 NOTE — Telephone Encounter (Signed)
Pharmacy calling stating that pt said that he was suppose to get a antibiotic

## 2017-01-01 NOTE — Patient Instructions (Addendum)
Zantac for heartburn - avoid foods that can cause heartburn below, and return to discuss these symptoms further in the next few weeks.  Can also discuss stool symptoms further at that time. Return to the clinic or go to the nearest emergency room if any of your symptoms worsen or new symptoms occur.  Strep test normal/negative. I will check a throat culture, but your sore throat is likely due to another virus. See information below. Cepacol if needed for sore throat.  Return to the clinic or go to the nearest emergency room if any of your symptoms worsen or new symptoms occur.    Sore Throat A sore throat is pain, burning, irritation, or scratchiness in the throat. When you have a sore throat, you may feel pain or tenderness in your throat when you swallow or talk. Many things can cause a sore throat, including:  An infection.  Seasonal allergies.  Dryness in the air.  Irritants, such as smoke or pollution.  Gastroesophageal reflux disease (GERD).  A tumor. A sore throat is often the first sign of another sickness. It may happen with other symptoms, such as coughing, sneezing, fever, and swollen neck glands. Most sore throats go away without medical treatment. Follow these instructions at home:  Take over-the-counter medicines only as told by your health care provider.  Drink enough fluids to keep your urine clear or pale yellow.  Rest as needed.  To help with pain, try:  Sipping warm liquids, such as broth, herbal tea, or warm water.  Eating or drinking cold or frozen liquids, such as frozen ice pops.  Gargling with a salt-water mixture 3-4 times a day or as needed. To make a salt-water mixture, completely dissolve -1 tsp of salt in 1 cup of warm water.  Sucking on hard candy or throat lozenges.  Putting a cool-mist humidifier in your bedroom at night to moisten the air.  Sitting in the bathroom with the door closed for 5-10 minutes while you run hot water in the  shower.  Do not use any tobacco products, such as cigarettes, chewing tobacco, and e-cigarettes. If you need help quitting, ask your health care provider. Contact a health care provider if:  You have a fever for more than 2-3 days.  You have symptoms that last (are persistent) for more than 2-3 days.  Your throat does not get better within 7 days.  You have a fever and your symptoms suddenly get worse. Get help right away if:  You have difficulty breathing.  You cannot swallow fluids, soft foods, or your saliva.  You have increased swelling in your throat or neck.  You have persistent nausea and vomiting. This information is not intended to replace advice given to you by your health care provider. Make sure you discuss any questions you have with your health care provider. Document Released: 10/19/2004 Document Revised: 05/07/2016 Document Reviewed: 07/02/2015 Elsevier Interactive Patient Education  2017 [employer].   Food Choices for Gastroesophageal Reflux Disease, Adult When you have gastroesophageal reflux disease (GERD), the foods you eat and your eating habits are very important. Choosing the right foods can help ease the discomfort of GERD. Consider working with a diet and nutrition specialist (dietitian) to help you make healthy food choices. What general guidelines should I follow? Eating plan   Choose healthy foods low in fat, such as fruits, vegetables, whole grains, low-fat dairy products, and lean meat, fish, and poultry.  Eat frequent, small meals instead of three large meals each  day. Eat your meals slowly, in a relaxed setting. Avoid bending over or lying down until 2-3 hours after eating.  Limit high-fat foods such as fatty meats or fried foods.  Limit your intake of oils, butter, and shortening to less than 8 teaspoons each day.  Avoid the following:  Foods that cause symptoms. These may be different for different people. Keep a food diary to keep track  of foods that cause symptoms.  Alcohol.  Drinking large amounts of liquid with meals.  Eating meals during the 2-3 hours before bed.  Cook foods using methods other than frying. This may include baking, grilling, or broiling. Lifestyle    Maintain a healthy weight. Ask your health care provider what weight is healthy for you. If you need to lose weight, work with your health care provider to do so safely.  Exercise for at least 30 minutes on 5 or more days each week, or as told by your health care provider.  Avoid wearing clothes that fit tightly around your waist and chest.  Do not use any products that contain nicotine or tobacco, such as cigarettes and e-cigarettes. If you need help quitting, ask your health care provider.  Sleep with the head of your bed raised. Use a wedge under the mattress or blocks under the bed frame to raise the head of the bed. What foods are not recommended? The items listed may not be a complete list. Talk with your dietitian about what dietary choices are best for you. Grains  Pastries or quick breads with added fat. [place] toast. Vegetables  Deep fried vegetables. [place] fries. Any vegetables prepared with added fat. Any vegetables that cause symptoms. For some people this may include tomatoes and tomato products, chili peppers, onions and garlic, and horseradish. Fruits  Any fruits prepared with added fat. Any fruits that cause symptoms. For some people this may include citrus fruits, such as oranges, grapefruit, pineapple, and lemons. Meats and other protein foods  High-fat meats, such as fatty beef or pork, hot dogs, ribs, ham, sausage, salami and bacon. Fried meat or protein, including fried fish and fried chicken. Nuts and nut butters. Dairy  Whole milk and chocolate milk. Sour cream. Cream. Ice cream. Cream cheese. Milk shakes. Beverages  Coffee and tea, with or without caffeine. Carbonated beverages. Sodas. Energy drinks. Fruit juice made with  acidic fruits (such as orange or grapefruit). Tomato juice. Alcoholic drinks. Fats and oils  Butter. Margarine. Shortening. Ghee. Sweets and desserts  Chocolate and cocoa. Donuts. Seasoning and other foods  Pepper. Peppermint and spearmint. Any condiments, herbs, or seasonings that cause symptoms. For some people, this may include curry, hot sauce, or vinegar-based salad dressings. Summary  When you have gastroesophageal reflux disease (GERD), food and lifestyle choices are very important to help ease the discomfort of GERD.  Eat frequent, small meals instead of three large meals each day. Eat your meals slowly, in a relaxed setting. Avoid bending over or lying down until 2-3 hours after eating.  Limit high-fat foods such as fatty meat or fried foods. This information is not intended to replace advice given to you by your health care provider. Make sure you discuss any questions you have with your health care provider. Document Released: 09/11/2005 Document Revised: 09/12/2016 Document Reviewed: 09/12/2016 Elsevier Interactive Patient Education  2017 [employer].     IF you received an x-ray today, you will receive an invoice from [facility]. Please contact [facility] at [phone] with questions or concerns  concerns regarding your invoice.   IF you received labwork today, you will receive an invoice from [facility]. Please contact [facility] at [phone] with questions or concerns regarding your invoice.   Our billing staff will not be able to assist you with questions regarding bills from these companies.  You will be contacted with the lab results as soon as they are available. The fastest way to get your results is to activate your My Chart account. Instructions are located on the last page of this paperwork. If you have not heard from us regarding the results in 2 weeks, please contact this office.      

## 2017-01-03 LAB — CULTURE, GROUP A STREP: STREP A CULTURE: NEGATIVE

## 2017-10-27 IMAGING — DX DG CHEST 2V
2 series · 2 of 2 positions shown · non-contrast
Comparison: 04/06/2015

CLINICAL DATA: Cough and sore throat for 4 days

EXAM:
CHEST  2 VIEW

[chest pa]
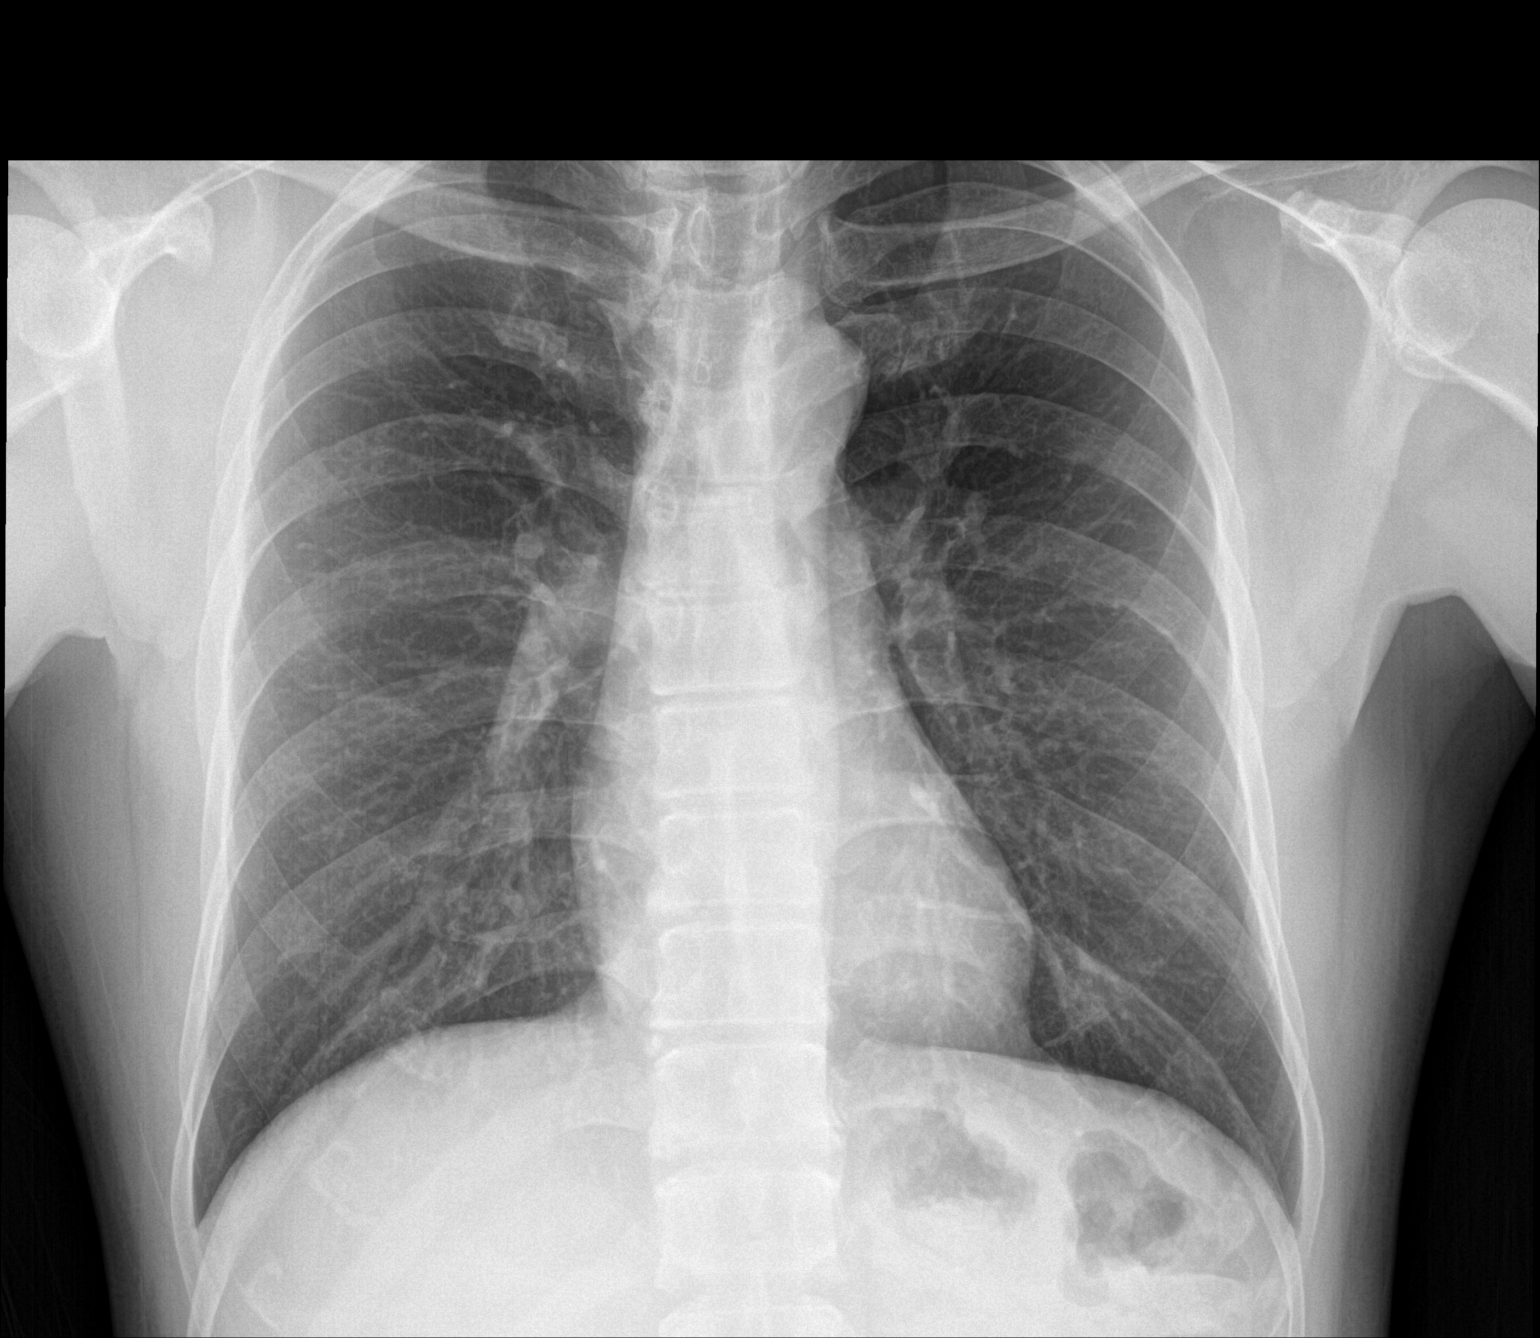

[chest lat]
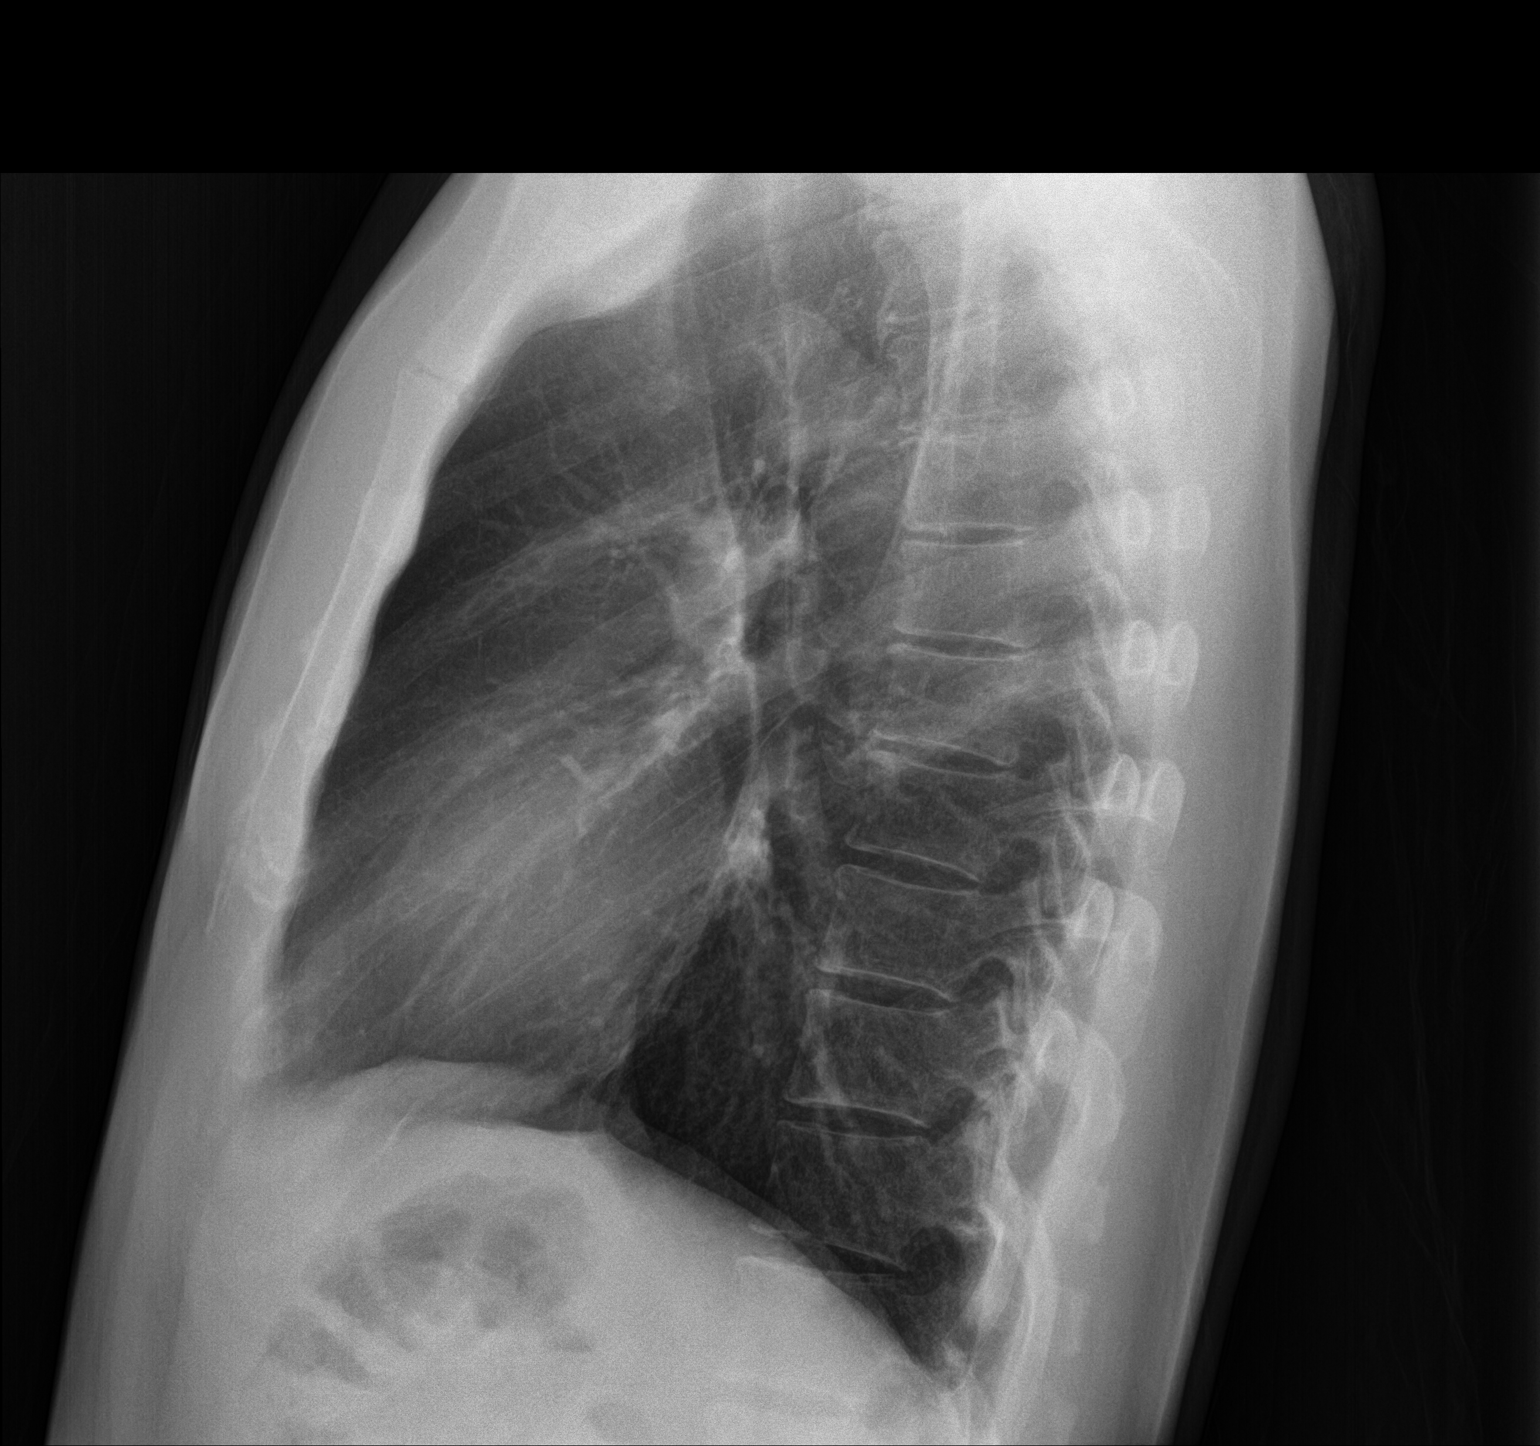

[2 of 2 positions shown; findings below may reference images not displayed]

FINDINGS: The heart size and mediastinal contours are within normal limits.
Both lungs are clear. The visualized skeletal structures are
unremarkable.
IMPRESSION: No active cardiopulmonary disease.

## 2018-02-20 IMAGING — US US SOFT TISSUE HEAD/NECK
1 series · 7 of 7 positions shown · non-contrast
Comparison: None.

CLINICAL DATA: Palpable abnormality of left neck in the
submandibular region.

EXAM:
ULTRASOUND OF HEAD/NECK SOFT TISSUES
TECHNIQUE: Ultrasound examination of the head and neck soft tissues was
performed in the area of clinical concern.

[Series 1: us soft tissue head/neck · 0.08mm/px · 7 acquisitions, 7 frames shown]
[im 1/7]
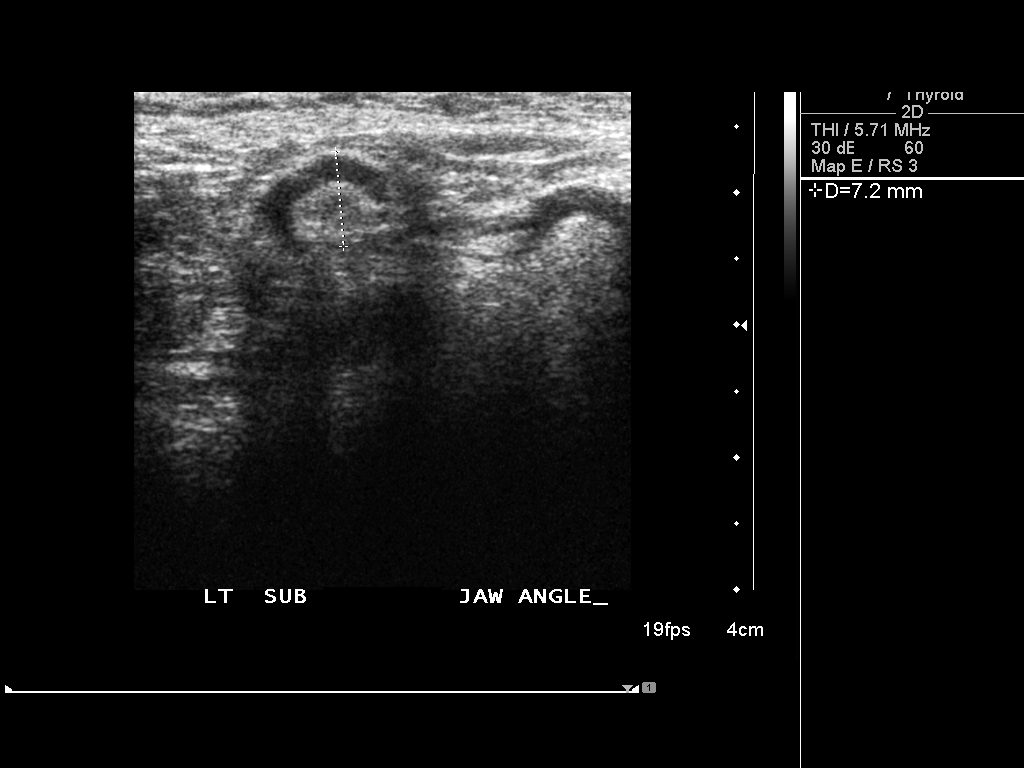
[im 2/7]
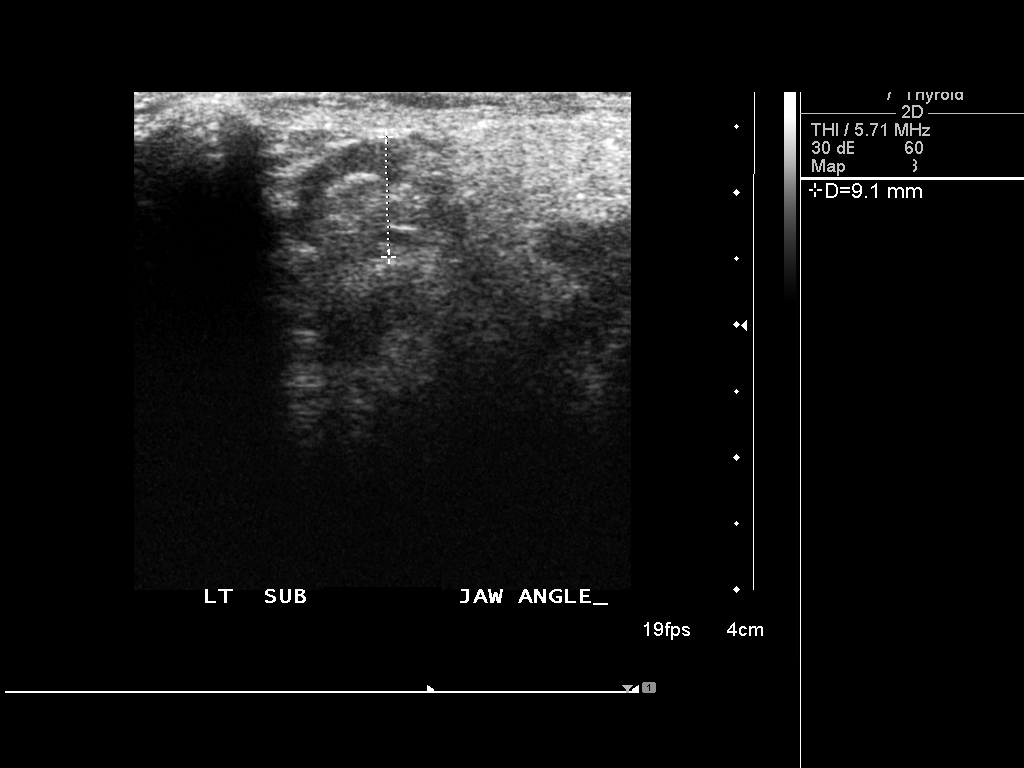
[im 3/7]
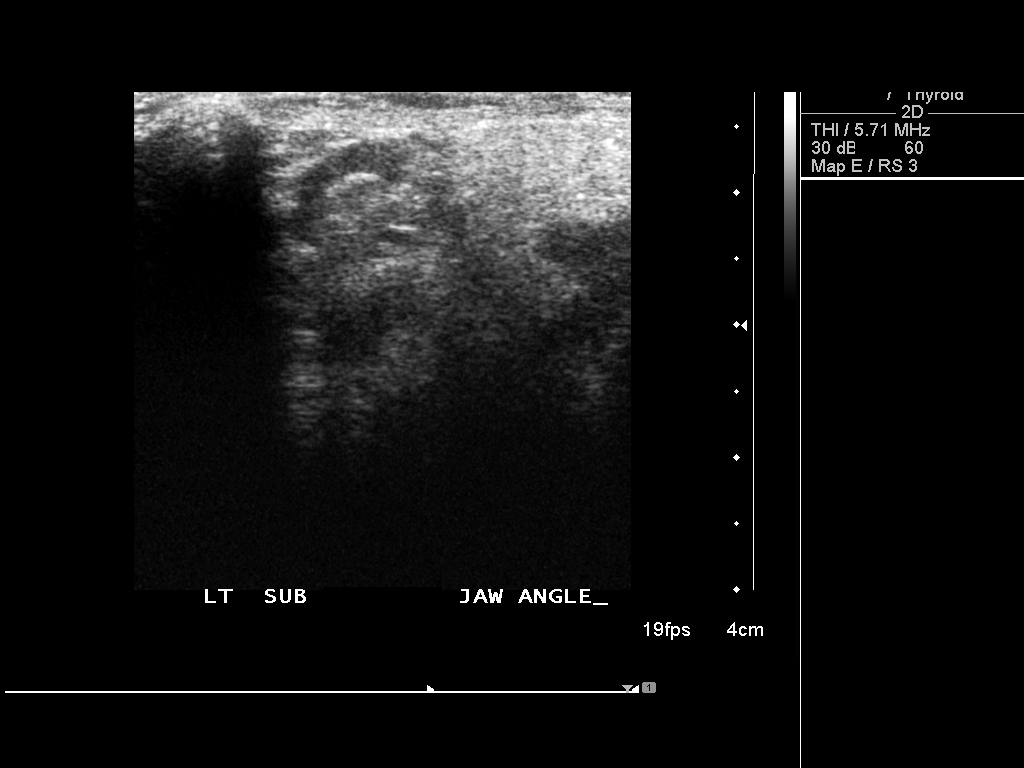
[im 4/7]
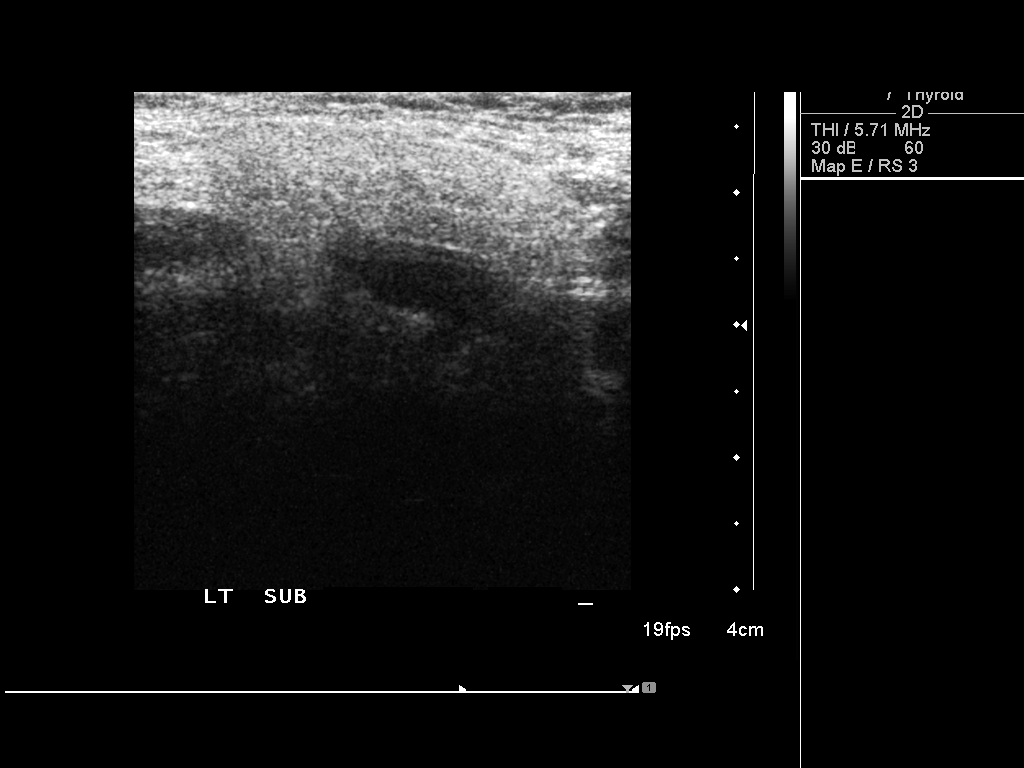
[im 5/7]
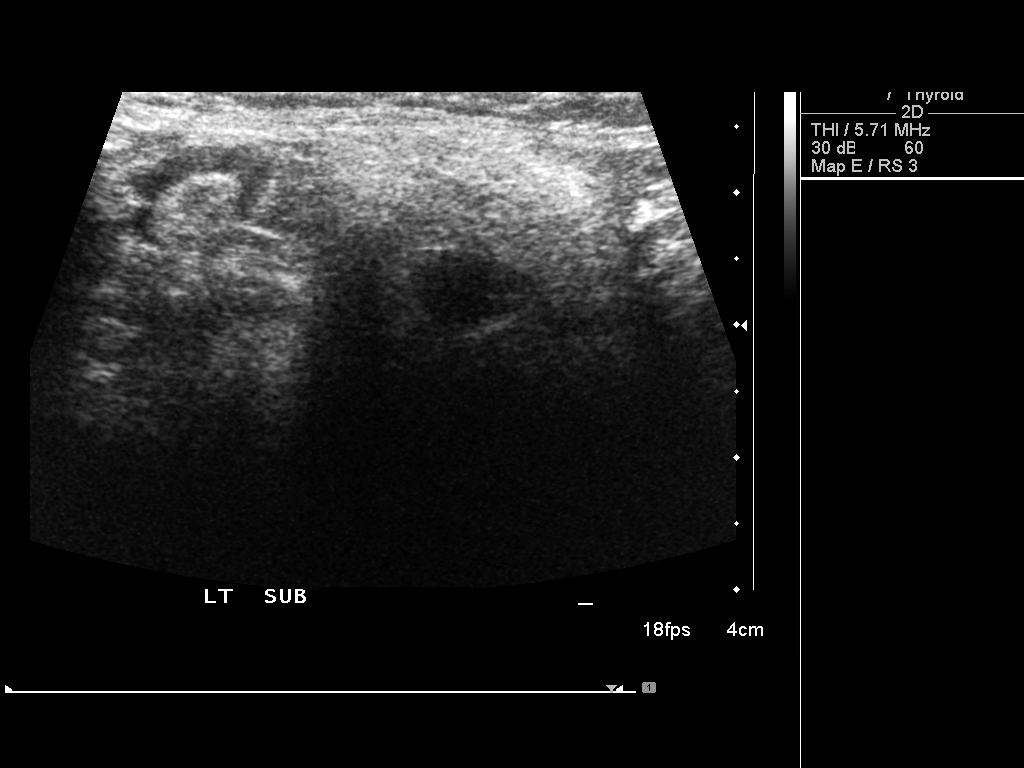
[im 6/7]
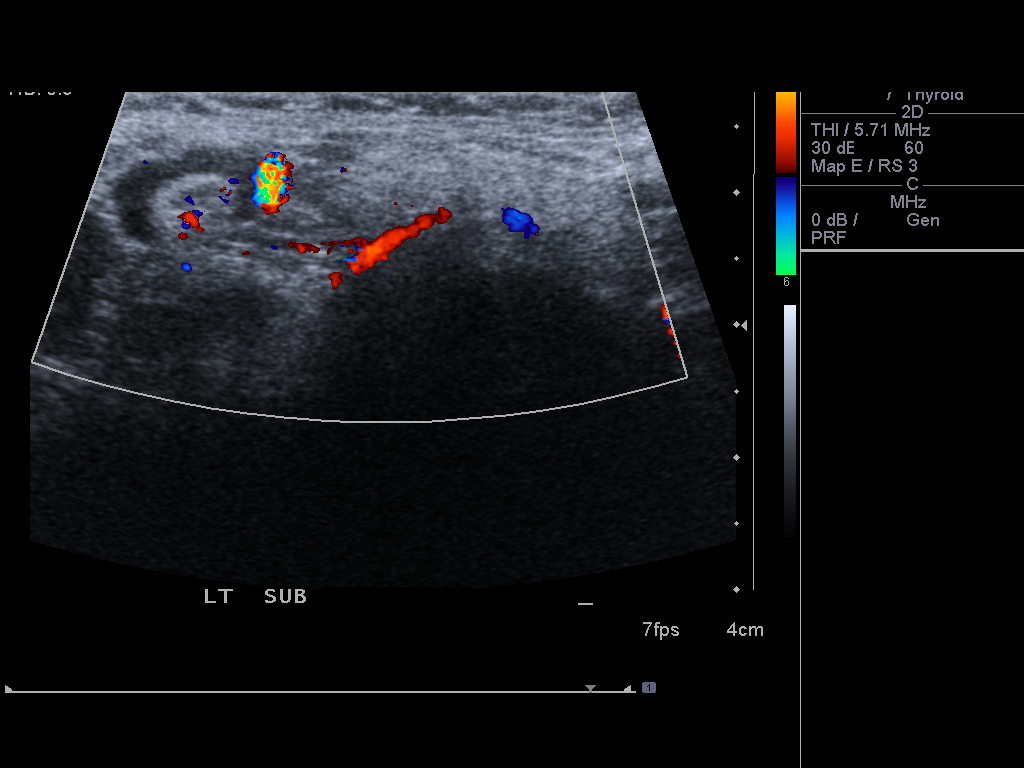
[im 7/7]
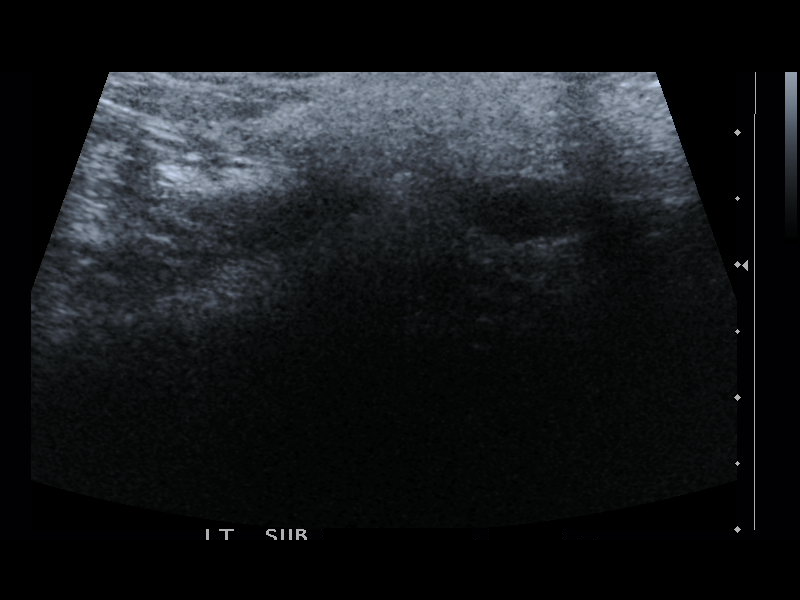

[7 of 7 positions shown; findings below may reference images not displayed]

FINDINGS: Ultrasound in the region of palpable abnormality demonstrates a
nonenlarged submandibular lymph node measuring approximately 8 mm in
short axis. This node has typical sonographic appearance of a lymph
node with fatty hilum. No other soft tissue abnormalities
identified.
IMPRESSION: Palpable abnormality corresponds to a nonenlarged and normal
appearing submandibular lymph node.
